# Patient Record
Sex: Female | Born: 1985 | Race: White | Hispanic: No | Marital: Married
Health system: Midwestern US, Academic
[De-identification: ages and names within clinical notes are randomized; demographics above are authoritative.]

---

## 2016-07-26 IMAGING — US OBEARLY
1 series · 13 of 16 positions shown · non-contrast
Comparison: none

[Series 1: us ob <(id) single or first f · 13 of 76 slices shown]
[im 1/76]
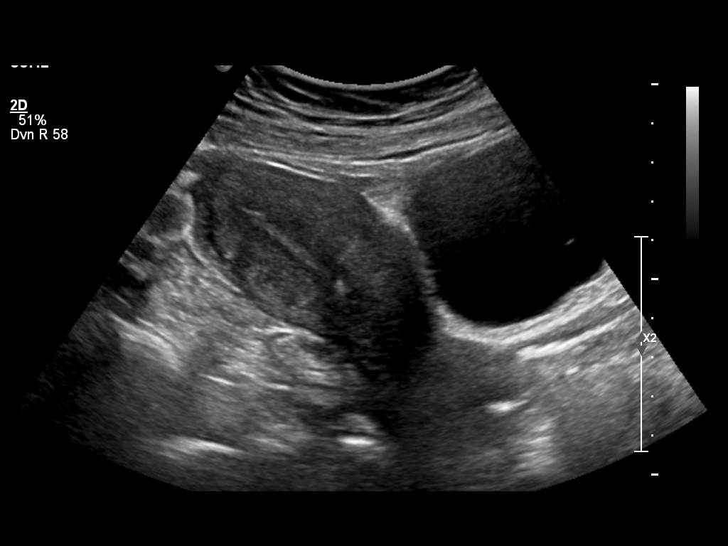
[im 6/76]
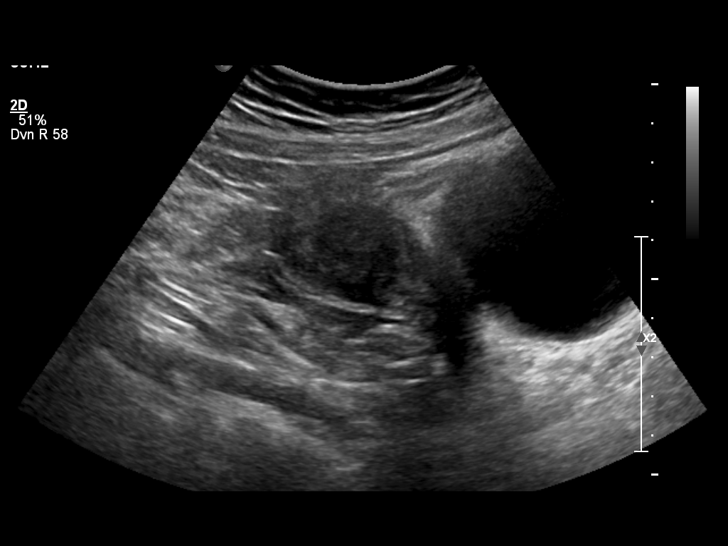
[im 16/76]
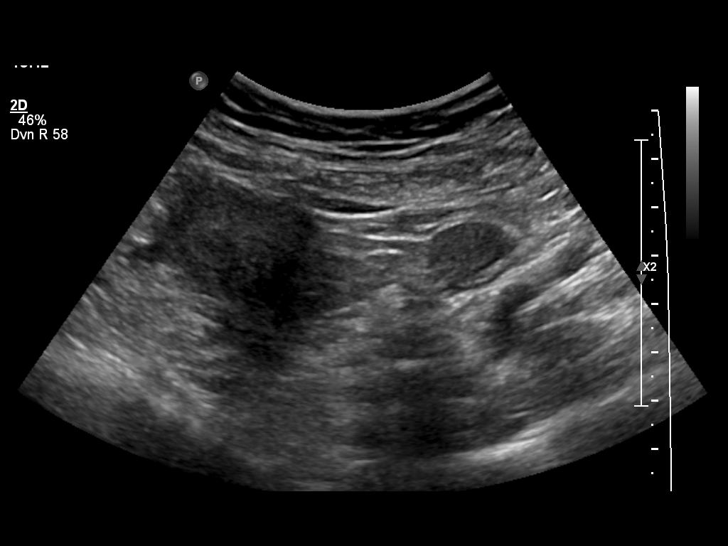
[im 21/76]
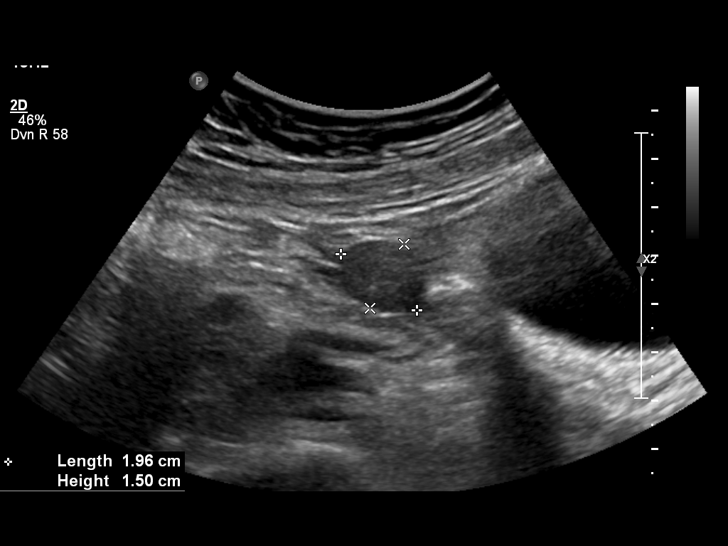
[im 26/76]
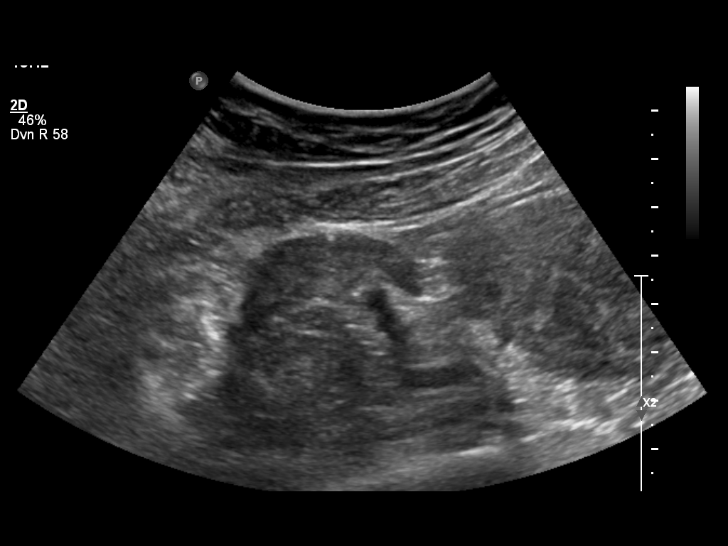
[im 31/76]
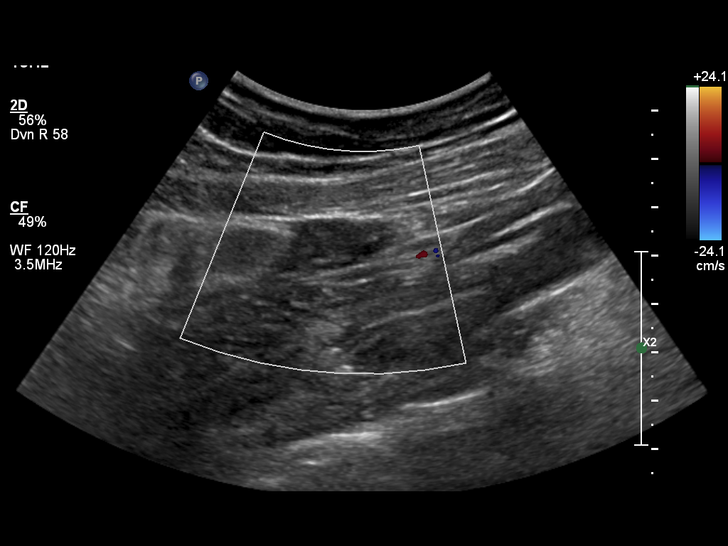
[im 41/76]
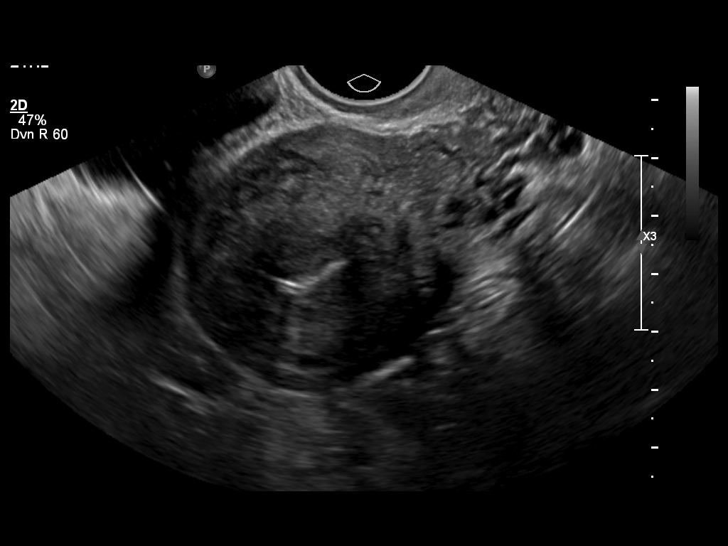
[im 46/76]
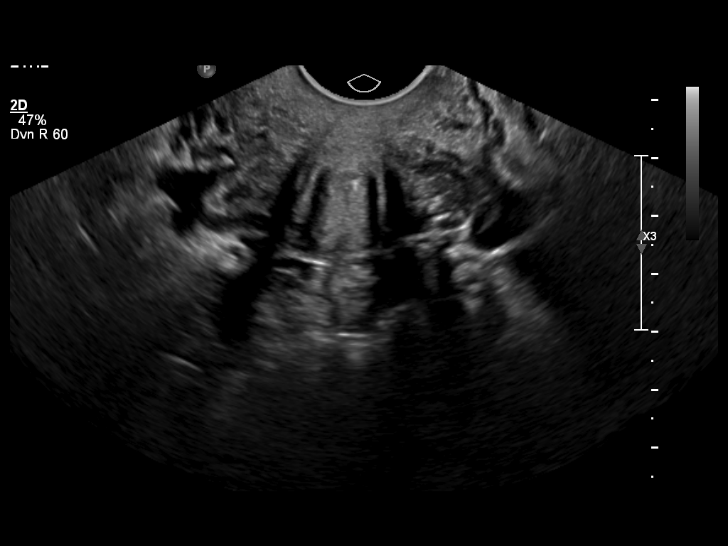
[im 51/76]
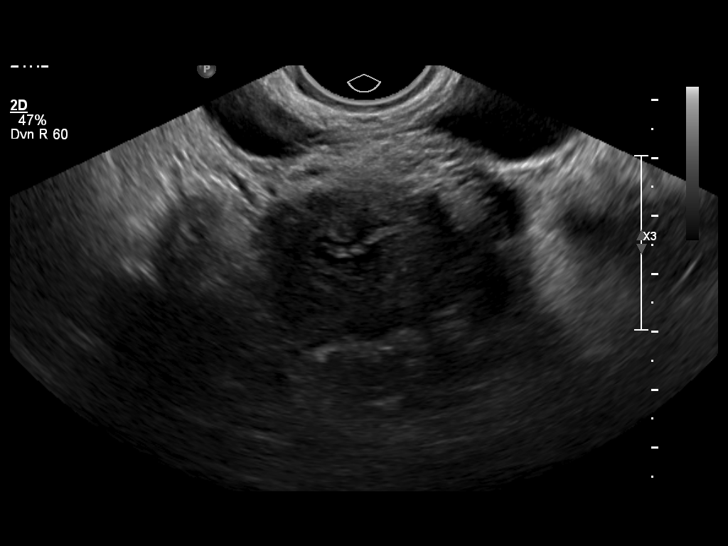
[im 56/76]
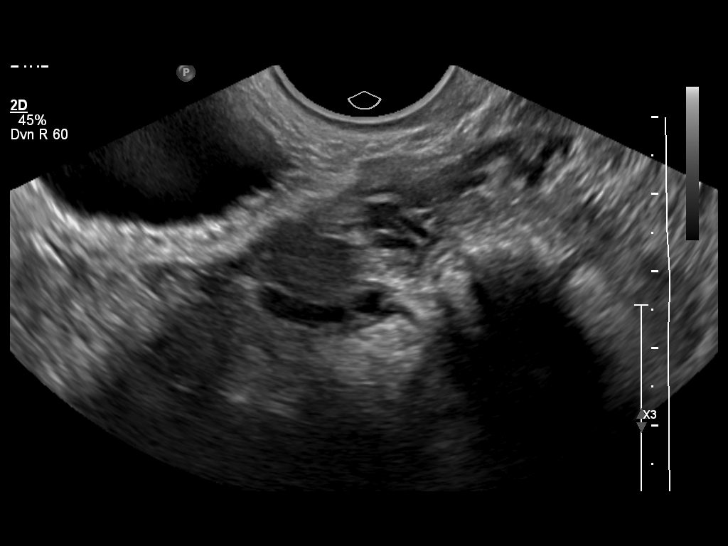
[im 61/76]
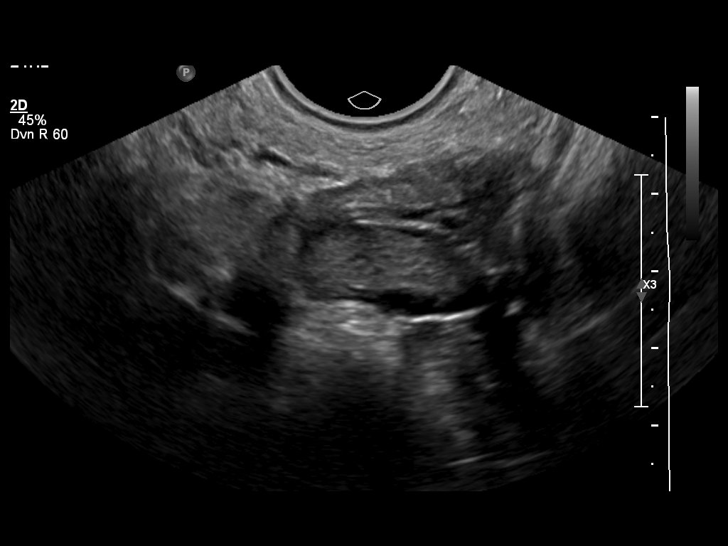
[im 71/76]
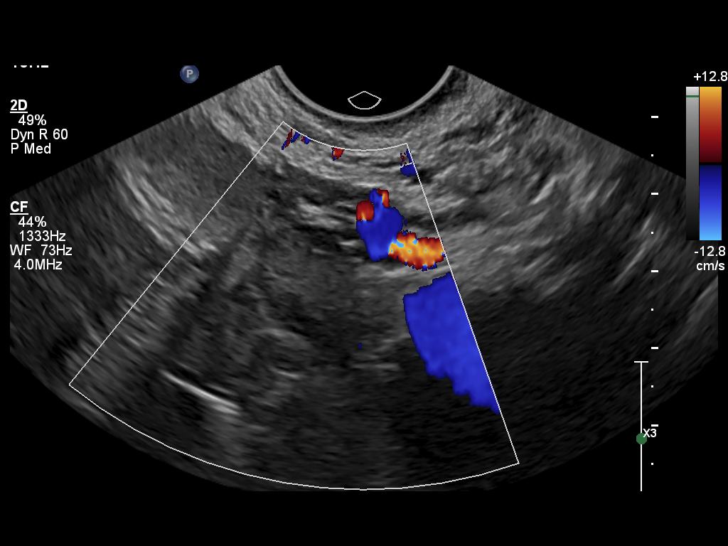
[im 76/76]
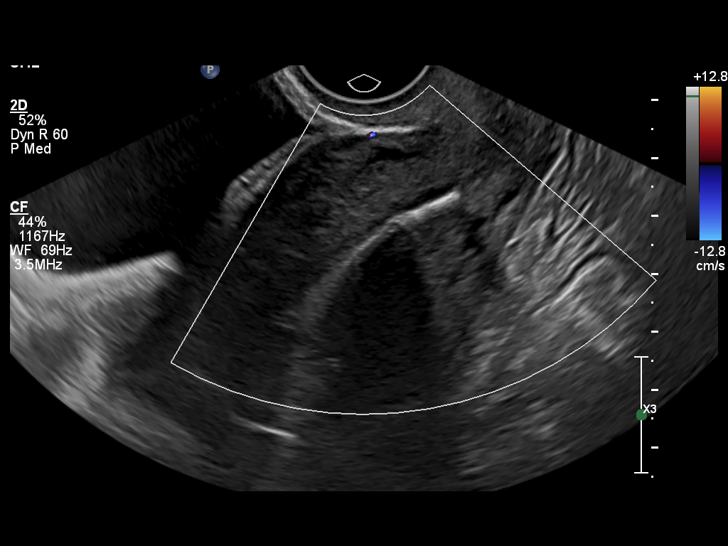

[13 of 16 positions shown; findings below may reference images not displayed]

ULTRASOUND REPORT

EXAM

ULTRASOUND, PELVIC (NONOBSTETRIC), REAL TIME WITH IMAGE DOCUMENTATION, COMPLETE, CPT 31851;
ULTRASOUND, TRANSVAGINAL, CPT 57511

INDICATION

Pregnancy with Mirena IUD in place. Quantitative beta hCG was measured at 22. JL

TECHNIQUE

Multiple static grayscale and color Doppler ultrasound images provided from a transabdominal and
transvaginal pelvic ultrasound.

COMPARISONS

There are no previous examinations available for comparison at the time of dictation.

FINDINGS

The uterus measures 9.5 x 4.6 x 5.3 cm. It is anteverted. The intrauterine device is identified in
place. There is no evidence of a fetal pole or yolk sac. There are no uterine masses. There is no
evidence of free fluid in the posterior cul-de-sac. Cervix measures 3.1 cm.

The right ovary measures 1.5 x 1.8 x 1 cm. The left ovary measures 2 x 2.3 x 1.5 cm . There are no
solid or cystic masses identified in either ovary. There are no adnexal masses. There is normal
blood flow to the ovaries identified bilaterally.

IMPRESSION

Normal uterus and ovaries. Intrauterine device is identified in place. There is no evidence of
intrauterine gestation at this time. In the absence of an intrauterine gestation an ectopic
gestation cannot be excluded and follow up with serial beta HCG and ultrasound is advised.

## 2020-04-24 ENCOUNTER — Encounter: Admit: 2020-04-24 | Discharge: 2020-04-24 | Payer: Commercial Managed Care - PPO

## 2020-04-28 ENCOUNTER — Encounter: Admit: 2020-04-28 | Discharge: 2020-04-28 | Payer: Commercial Managed Care - PPO

## 2020-04-28 DIAGNOSIS — N6452 Nipple discharge: Secondary | ICD-10-CM

## 2020-04-28 DIAGNOSIS — N6320 Unspecified lump in the left breast, unspecified quadrant: Secondary | ICD-10-CM

## 2020-05-07 ENCOUNTER — Encounter: Admit: 2020-05-07 | Discharge: 2020-05-07 | Payer: Commercial Managed Care - PPO

## 2020-05-07 NOTE — Telephone Encounter
Navigation Intake Assessment    Patient Name:  Analyah Mcconnon  DOB: 11-Aug-1985  Insurance:  [employer] Rossetti   Direct Referral: None  Appointment Info:    Future Appointments   Date Time Provider [place]   05/12/2020  1:00 PM Mina Marble, PA-C [id number] [place] Exam   05/12/2020  2:30 PM MAMMO - IC ROOM 2 IC1MAMM ICC Radiolog   05/12/2020  3:00 PM SONO - IC BREAST ROOM 2 IC1SONO ICC Radiolog       Diagnosis & Reason for Visit:  Left breast masses and pain, Left nipple discharge, 2nd opinion      Physician Info:    Referring Physician:  Self-referral   Contact Name & Number:  N/A   PCP:  Darl Householder, DO    Location of Films:   PACS    Location of Pathology:  No prior breast pathology studies performed.     History of Present Illness: Patient reports left breast pain that has been occurring since August 2021. Patient reports clear and yellow left nipple discharge (expressed only) that has been occurring since August 2021. Patient had a left breast ultrasound ([place]) on 04/18/20. 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 Patient reports she palpated a 2nd mass in her left breast within the past week. Patient would like a 2nd opinion at [facility]. Patient denies any other current breast concerns. Patient denies any history of breast biopsies or surgeries.     Allergies reviewed and verified with the patient, and documented in Epic:  Yes    Family History of Breast Cancer:   Maternal Great Aunt-Breast cancer.   Father-Prostate cancer.   Maternal Grandmother-Ovarian cancer.     Personal History of Other Cancers: None

## 2020-05-12 ENCOUNTER — Ambulatory Visit: Admit: 2020-05-12 | Discharge: 2020-05-12 | Payer: Commercial Managed Care - PPO

## 2020-05-12 ENCOUNTER — Encounter: Admit: 2020-05-12 | Discharge: 2020-05-12 | Payer: Commercial Managed Care - PPO

## 2020-05-12 DIAGNOSIS — N6320 Unspecified lump in the left breast, unspecified quadrant: Secondary | ICD-10-CM

## 2020-05-12 DIAGNOSIS — N6452 Nipple discharge: Secondary | ICD-10-CM

## 2020-05-12 DIAGNOSIS — N644 Mastodynia: Secondary | ICD-10-CM

## 2020-05-12 NOTE — Progress Notes
Name: Kelsey [name]          MRN: [id number]      DOB: [birth year]      AGE: 34 y.o.   DATE OF SERVICE: 05/12/2020               Reason for Visit:  Heme/Onc Care      Kelsey [name] is a 34 y.o. female.     DIAGNOSIS:    1. Left breast mass  2. Left nipple discharge     History of Present Illness    Kelsey [name] is a Caucasian female who presented to the [facility] on 05/12/2020 at age 34 for evaluation of left breast mass and nipple discharge. Kelsey [name] reports left breast pain that has been occurring since August 2021. She reports clear and yellow nonspontaneous left nipple dischargethat has been occurring since August 2021. 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 She palpated an area in her left breast within the past week. She enies any other current breast concerns. She denies any history of breast biopsies or surgeries.     BREAST IMAGING:  Ultrasound:    -- Left breast ultrasound 04/18/20 ([facility]) revealed benign left axillary lymph node. No suspicious imaging finding. Hypoecohic macrolobulated circumscribed solid mass with posterior acoustic enhancement likely compatible with a fibroadenoma for the patient's age. This is located at 3:00, 4 cm FTN. BI-RADS 2.      REPRODUCTIVE HEALTH:  Age at first Menarche: 47   Age at First Live Birth:  52  Age at Menopause:  Premenopausal  Gravida:  6  Para: 4  Breastfeeding:  Yes    PERTINENT PMH:  Low platelets  FAMILY HISTORY:  Maternal Great Aunt-Breast cancer. Father-Prostate cancer. Maternal Grandmother-Ovarian cancer  PHYSICAL EXAM on PRESENTATION: Right - No palpable breast masses. No skin, nipple, or areolar change. Left - Pea-sized lump at 12:30 in area of patient's palpable finding. No supraclavicular or axillary adenopathy.   REFERRED BY:  Self       Review of Systems    Constitutional: Negative for fever, chills, appetite change and fatigue.   HENT: Negative for hearing loss, congestion, rhinorrhea and tinnitus.    Eyes: Negative for pain, discharge and itching.   Respiratory: Negative for cough, chest tightness and shortness of breath.    Cardiovascular: Negative for chest pain and palpitations.   Gastrointestinal: Negative for abdominal distention, pain, nausea, vomiting, and diarrhea.   Genitourinary: Negative for frequency, vaginal bleeding, difficulty urinating and pelvic pain.   Musculoskeletal: Negative for myalgias, back pain, joint swelling and arthralgias.   Skin: Negative for rash.   Neurological: Negative for dizziness, weakness, light-headedness and headaches.   Hematological: Does not bruise/bleed easily.   Psychiatric/Behavioral: Negative for disturbed wake/sleep cycle. The patient is not nervous/anxious.    No Known Allergies    History reviewed. No pertinent past medical history.  Surgical History:   Procedure Laterality Date   ? HX WISDOM TEETH EXTRACTION       Family History   Problem Relation Age of Onset   ? Cancer-Breast Maternal Aunt    ? Cancer-Ovarian Maternal Grandmother      Social History     Socioeconomic History   ? Marital status: Married     Spouse name: Not on file   ? Number of children: Not on file   ? Years of education: Not on file   ?  Highest education level: Not on file   Occupational History   ? Not on file   Tobacco Use   ? Smoking status: Current Every Day Smoker     Packs/day: 0.50     Years: 3.00     Pack years: 1.50     Types: Cigarettes   ? Smokeless tobacco: Never Used   ? Tobacco comment: half a pack a week   Substance and Sexual Activity   ? Alcohol use: Yes     Alcohol/week: 1.0 standard drink     Types: 1 Cans of beer per week   ? Drug use: Never   ? Sexual activity: Not on file   Other Topics Concern   ? Not on file   Social History Narrative   ? Not on file                   Objective:         ? ferrous sulfate (IRON PO) Take 1 tablet by mouth daily.     Vitals:    05/12/20 1251   BP: (!) 144/101   BP Source: Arm, Left Upper   Patient Position: Sitting   Pulse: 115   Resp: 18   Temp: 36.3 ?C (97.3 ?F)   TempSrc: Temporal   SpO2: 100%   Weight: 86.3 kg (190 lb 3.2 oz)   Height: 167.6 cm (66)   PainSc: Zero     Body mass index is 30.7 kg/m?Marland Kitchen     Pain Score: Zero       Fatigue Scale: 0-None    Pain Addressed:  Diagnostic test ordered    Patient Evaluated for a Clinical Trial: No treatment clinical trial available for this patient.     [place] Cooperative Oncology Group performance status is Not applicable. Marland Kitchen     Physical Exam  Vitals reviewed.     RIGHT BREAST EXAM:  Breast:  No palpable masses. Dense tissue throughout  Skin Erythema:  No  Attachment of Overlying Skin:  No  Peau d' orange:  No  Chest Wall Attachment:  No  Nipple Inversion:  No  Nipple Discharge: No    LEFT BREAST EXAM:  Breast: Pea-sized mass at 12:30. No skin or nipple changes. Dense tissue throughout  Skin Erythema:  No  Attachment of Overlying Skin:  No  Peau d' orange:  No  Chest Wall Attachment: No  Nipple Inversion:  No  Nipple Discharge:  No    RIGHT NODAL BASIN EXAM:  Axillary:  negative  Infraclavicular:  negative  Supraclavicular:  negative    LEFT NODAL BASIN EXAM:  Axillary:  negative  Infraclavicular: negative  Supraclavicular:  negative      Constitutional: Well-developed and well-nourished. No acute distress.  HEENT:  Head: Normocephalic and atraumatic.  Eyes: Pupils are equal, round and reactive to light. No discharge. No scleral icterus.  Cardiovascular: Normal rate, regular rhythm and normal heart sounds. No murmur or gallop.  Pulmonary/Chest: Effort normal and breath sounds normal. No respiratory distress. No wheezes. No rales.   Musculoskeletal: Normal range of motion. No edema.  Neurological: Alert and oriented to person, place and time. No cranial nerve deficit.  Skin: Warm and dry. No rash noted. No erythema. No pallor.  Psychiatric: Normal mood and affect. Behavior is normal. Judgement and thought content normal.            Assessment and Plan:  Left breast mass  Left nipple discharge    Kelsey [name] had an  outside ultrasound for pain on 04/18/20 and a probable fibroadenoma was seen and no follow up imaging was recommended. In the past week, Kelsey [name] has palpated a new left breast mass at 12:30. She also reports non spontaneous left nipple discharge. We discussed that non spontaneous nipple discharge She is scheduled for targeted imaging today. 3 outcomes from the targeted imaging 1) No further follow up imaging recommended, 2) Follow up imaging in 3-6 months, or 3) Biopsy. I will contact her with the results. She does have a family history of breast and ovarian cancer in her maternal great aunt and grandmother. She reports her mother has 3 sisters none of who have had breast or ovarian cancer. I would recommend her mother consider genetic testing and if her mother is not interested, then Kelsey [name] could consider genetic testing. For her breast pain, I recommend OTC medications for her breast pain and provided a breast pain handout. She was given ample time to ask questions all of which were answered to her satisfaction. She was encouraged to call with any interval questions or concerns.    1. Targeted imaging today  2. Breast pain handout  3. Have mother consider genetic testing    Guy Begin, PA-C      ADDENDUM 05/12/20:  I spoke with Kelsey [name] and let her know that her imaging today showed a probable benign fibroadenoma. Since this is has not been seen on long term imaging a 6 month follow up was recommended. There were no abnormalities in the area of palpable concern at 12:30 or for the nipple discharge. I will have scheduling contact her for the 6 month follow up.    MAMMO DIAGNOSTIC LT/TOMO: LEFT BREAST - May 12, 2020   - ACCESSION #: [id number]   2D/3D Procedure   3D Routine views.   2D Routine views.     Prior study comparison: April 18, 2020, ultrasound.     34 year old female presents for evaluation of 2 palpable areas of concern in the left breast.     2-D and 3-D images of the left breast were obtained.     No suspicious calcifications or architectural distortion is seen.   In the 3:00 position of the left breast at middle depth, there   is a 1.3 cm circumscribed, oval, equal density mass, with benign   mammographic features.     [id number] US BREAST TARGET LT: LEFT BREAST - May 12, 2020 -   ACCESSION #: [id number]   Standard views.     Technologist: Margaretmary Dys. Banks, Sonographer   Targeted ultrasound of the palpable areas of concern in the left   breast at 12:30, 10 cm from the nipple and at 3:30, 5 cm from the   nipple, demonstrates no suspicious sonographic abnormality.     The patient reports nonpathologic, nonspontaneous left nipple   discharge and therefore targeted ultrasound of the subareolar   area was performed. No suspicious sonographic abnormality is seen   in the subareolar left breast.     Targeted ultrasound of the left breast at 2:30, 5 cm from the   nipple, demonstrates a 1.0 x 0.7 x 1.1 cm circumscribed,   hypoechoic mass with internal blood flow. This is likely a   fibroadenoma and targeted ultrasound of the left breast is   recommended in 6 months to confirm longer term stability.     No suspicious left axillary lymph nodes are seen.       Finalized by Gwynneth Aliment,  M.D. on 05/12/2020 2:20 PM.   Dictated by Gwynneth Aliment, M.D. on 05/12/2020 2:14 PM.       Electronically signed and approved by: Gwynneth Aliment, M.D.   [id number]   IMPRESSION     ASSESSMENT: BIRAD 3 -Probably benign (Overall)   Left breast MAMMO DIAG LT/T: BIRAD 0-incomplete: need additional   imaging evaluation of the left breast.   Left breast US TARGET LEFT: BIRAD 3 -probably benign finding in   the left breast.         RECOMMENDATION:   Ultrasound of the left breast in 6 months.     Guy Begin, PA-C

## 2020-05-19 ENCOUNTER — Encounter: Admit: 2020-05-19 | Discharge: 2020-05-19 | Payer: Commercial Managed Care - PPO

## 2020-05-19 DIAGNOSIS — N6320 Unspecified lump in the left breast, unspecified quadrant: Secondary | ICD-10-CM

## 2020-06-19 ENCOUNTER — Encounter: Admit: 2020-06-19 | Discharge: 2020-06-19 | Payer: Commercial Managed Care - PPO

## 2020-06-20 ENCOUNTER — Encounter: Admit: 2020-06-20 | Discharge: 2020-06-20 | Payer: Commercial Managed Care - PPO

## 2020-07-01 ENCOUNTER — Encounter: Admit: 2020-07-01 | Discharge: 2020-07-01 | Payer: Commercial Managed Care - PPO

## 2020-07-01 DIAGNOSIS — N6320 Unspecified lump in the left breast, unspecified quadrant: Secondary | ICD-10-CM

## 2020-07-02 ENCOUNTER — Encounter: Admit: 2020-07-02 | Discharge: 2020-07-02 | Payer: Commercial Managed Care - PPO

## 2020-07-28 ENCOUNTER — Encounter

## 2020-07-28 DIAGNOSIS — D45 Polycythemia vera: Secondary | ICD-10-CM

## 2020-07-28 LAB — CBC AND DIFF
Lab: 0.1 K/UL (ref 0–0.20)
Lab: 0.2 K/UL (ref 0–0.45)
Lab: 0.6 K/UL (ref 0–0.80)
Lab: 1 % (ref >60)
Lab: 11 K/UL — ABNORMAL HIGH (ref 4.5–11.0)
Lab: 14 % (ref 11–15)
Lab: 14 g/dL (ref 12.0–15.0)
Lab: 18 % — ABNORMAL LOW (ref 24–44)
Lab: 2 % (ref 0–5)
Lab: 2 K/UL (ref 1.0–4.8)
Lab: 28 pg (ref 26–34)
Lab: 33 g/dL (ref 32.0–36.0)
Lab: 43 % (ref 36–45)
Lab: 5 % (ref 4–12)
Lab: 5 M/UL — ABNORMAL HIGH (ref 4.0–5.0)
Lab: 7.7 FL (ref 7–11)
Lab: 74 % (ref 41–77)
Lab: 8.1 K/UL — ABNORMAL HIGH (ref 1.8–7.0)
Lab: 822 K/UL — ABNORMAL HIGH (ref 150–400)
Lab: 86 FL (ref 80–100)

## 2020-07-28 LAB — COMPREHENSIVE METABOLIC PANEL: Lab: 137 MMOL/L (ref 137–147)

## 2020-07-28 LAB — LDH-LACTATE DEHYDROGENASE: Lab: 160 U/L (ref 100–210)

## 2020-07-28 NOTE — Patient Instructions
Your care team:     Dr. Abdulraheem Yacoub   Hematologist   Ola Kilanko, APRN   Nurse Practitioner   Clinical Nurse Coordinators (CNC's)   Cami Solano RN, BSN   Denia Mcvicar RN, BSN       Phone numbers:     Scheduling # [phone]    CNCs Cami and Chun Sellen # [phone]  Messages left on nurses' line are checked Monday-Friday 8:00 AM - 4:00 PM.    Evening (after 4:00 PM), weekend and holiday on-call # [phone]   For urgent needs after hours, please ask for the oncologist on-call to be paged.   For urgent needs during business hours, please ask for Cami or Marye Eagen, Dr. Yacoub's nurses, to be paged.    Notes:   - Allow one business week for our office to complete any requested paperwork (FMLA, etc.)   - Allow two to three business days for all medication refills. Please call your pharmacy first to check for available refills.

## 2020-07-29 ENCOUNTER — Encounter: Admit: 2020-07-29 | Discharge: 2020-07-29 | Payer: Commercial Managed Care - PPO

## 2020-08-12 ENCOUNTER — Encounter: Admit: 2020-08-12 | Discharge: 2020-08-12 | Payer: Commercial Managed Care - PPO

## 2020-09-22 NOTE — Progress Notes
Interventional Radiology Outpatient Scheduling Checklist      1.  Name of Procedure(s):   Bone Marrow Biopsy      2.  Date of Procedure:   10/13/2020      3.  Arrival Time:   0900      4.  Procedure Time:  1000      5.  Correct Procedural Room Assignment:  IR [facility] Room #6      6.  Blood Thinners Triaged and instructed per protocol: Y/N/NA:  NA  Confirmed accurate instructions sent to patient: Y/N:  NA       7.  Procedure Order Verified: Y/N:  Yes      9.  Patient instructed to have a driver: Y/N/NA:  Yes    10.  Patient instructed on NPO status: Y/N/NA:  Yes  0200 and 0800  Confirmed accurate instructions sent to patient: Y/N:  Yes    11.  Specimen needed: Y/N/NA:  Yes   Verified Order placed: Y/N:  Yes    12.  Allergies Verified:  Y/N:  Yes    13.  Is there an Iodine Allergy: Y/N:  No  Does the Procedure Require contrast: Y/N:  NO   If so, was the IR- Contrast Allergy Pre-Procedure Medication protocol ordered: Y/NA:  NA    14.  Does the patient have labs according to IR Pre-procedure Laboratory Parameter policy: Y/N/NA:  Yes    Patient labs from 07/28/2020  OK  If No, was the patient instructed to obtain labs prior to procedure: Y/N/NA:  NA     15.  Will the patient need to be admitted or have a possible admission: Y/N:  No  If yes, confirmed accurate instructions sent to patient: Y/N/NA:  NA     16.  Patient States Understanding: Y/N:  Yes    17.  History of OSA:  Y/N:  No  If yes, confirm request to bring CPAP sent to patient: Y/N/NA:  NA    18. Does the patient have an insulin pump or continuous glucose monitor? Y/N   N/A   If yes, was the patient instructed that this will need to be removed for this procedure and to bring supplies to reapply once the procedure is complete? Y/N    19. Patient was sent electronic procedure instructions: Y/N:  Yes

## 2020-09-22 NOTE — Patient Education
Dear Ms Clent Ridges,    Thank you for choosing [facility] Interventional Radiology for your procedure. Your appointment information is listed below:    Appointment Date: 10/13/2020  Appointment Time: 10AM  Arrival Time: 9AM  Location:     ? Main Campus: [address], [place], [place]  [address]  Parking: [address] Parking Garage    INTERVENTIONAL RADIOLOGY  PRE-PROCEDURE INSTRUCTIONS SEDATION    You are scheduled for a procedure in Interventional Radiology with procedural sedation.  Please follow these instructions and any direction from your Primary Care/Managing Physician.  If you have questions about your procedure or need to reschedule please call [phone].    Medication Instructions:   You may take the following medications with a small sip of water:  Continue taking your normal morning medications as directed.    Diet Instructions:  a. (8) hours 2AM before your procedure, stop your regular diet and start a clear liquid diet.  b. (6) hours before your procedure, discontinue tube feedings and chewing tobacco.  c. (2) hours 8AM before your procedure discontinue clear liquids.  You should have nothing by mouth. This includes GUM or CANDY.     Clear Liquid Diet    Water  Apple or White Grape Juice  Coffee or tea without cream   Tea  White Cranberry Juice  Chicken Bouillon or Broth (no noodles)  Soda Pop  Popsicles   Beef Bouillon or Broth (no noodles)    Day of Exam Instructions:  1. Bathe or shower with an antibacterial soap prior to your appointment.  2. If you have a history of Obstructive Sleep Apnea (OSA) bring your CPAP/BIPAP.   3. Bring a list of your current medications and the dosages.  4. Wear comfortable clothing and leave valuables at home.  5. Arrive (1) hour prior to your appointment.  This time will be spent registering, interviewing, assessing, educating and preparing you for the test.  ? You will be with [place] anywhere from 30 minutes to 6 hours after your exam depending on your procedure.  6. You will be sedated for the procedure. A responsible adult must drive you home (no Benedetto Goad, taxis or buses are allowed) and stay with you overnight. If you do not have a driver we will be unable to perform your procedure.   7. You will not be able to return to work or drive the same day if receiving sedation.

## 2020-10-07 ENCOUNTER — Encounter: Admit: 2020-10-07 | Discharge: 2020-10-07 | Payer: Private Health Insurance - Indemnity

## 2020-10-13 ENCOUNTER — Encounter: Admit: 2020-10-13 | Discharge: 2020-10-13 | Payer: Private Health Insurance - Indemnity

## 2020-10-13 ENCOUNTER — Ambulatory Visit: Admit: 2020-10-13 | Discharge: 2020-10-13 | Payer: Private Health Insurance - Indemnity

## 2020-10-13 DIAGNOSIS — D45 Polycythemia vera: Principal | ICD-10-CM

## 2020-10-13 LAB — MANUAL DIFF
BASOPHILS: 2 % (ref 0–2)
EOSINOPHIL %: 5 % — ABNORMAL HIGH (ref 0–5)
LYMPHOCYTES: 31 % (ref 24–44)
MONOCYTES %: 3 % — ABNORMAL LOW (ref 4–12)
NEUTROPHILS,SEG: 59 % (ref 41–77)

## 2020-10-13 LAB — CBC W/DIFF BM
HEMATOCRIT: 43 % (ref 36–45)
HEMOGLOBIN: 14 g/dL (ref 12.0–15.0)
MCH: 27 pg (ref 26–34)
MCHC: 32 g/dL (ref 32.0–36.0)
MCV: 84 FL (ref 80–100)
MPV: 8.1 FL (ref 7–11)
PLATELET COUNT: 775 K/UL — ABNORMAL HIGH (ref 150–400)
RBC COUNT: 5.1 M/UL — ABNORMAL HIGH (ref 4.0–5.0)
RDW: 13 % (ref 11–15)
WBC COUNT: 8.9 K/UL (ref 4.5–11.0)

## 2020-10-13 MED ORDER — NALOXONE 0.4 MG/ML IJ SOLN
.08 mg | INTRAVENOUS | 0 refills | Status: DC | PRN
Start: 2020-10-13 — End: 2020-10-13

## 2020-10-13 MED ORDER — FLUMAZENIL 0.1 MG/ML IV SOLN
.2 mg | INTRAVENOUS | 0 refills | Status: DC | PRN
Start: 2020-10-13 — End: 2020-10-13

## 2020-10-13 MED ORDER — FENTANYL CITRATE (PF) 50 MCG/ML IJ SOLN
0 refills | Status: CP
Start: 2020-10-13 — End: ?
  Administered 2020-10-13: 15:00:00 50 ug via INTRAVENOUS
  Administered 2020-10-13: 16:00:00 25 ug via INTRAVENOUS
  Administered 2020-10-13 (×2): 50 ug via INTRAVENOUS

## 2020-10-13 MED ORDER — MIDAZOLAM 1 MG/ML IJ SOLN
0 refills | Status: CP
Start: 2020-10-13 — End: ?
  Administered 2020-10-13 (×2): 1 mg via INTRAVENOUS

## 2020-10-13 MED ORDER — SODIUM CHLORIDE 0.9 % IV SOLP
0 refills | Status: CP
Start: 2020-10-13 — End: ?
  Administered 2020-10-13: 16:00:00 500 mL via INTRAVENOUS

## 2020-10-13 MED ORDER — MIDAZOLAM 1 MG/ML IJ SOLN
1 mg | Freq: Once | INTRAVENOUS | 0 refills | Status: CP
Start: 2020-10-13 — End: ?
  Administered 2020-10-13: 15:00:00 1 mg via INTRAVENOUS

## 2020-10-13 NOTE — Other
Immediate Post Procedure Note    Date:  10/13/2020                                         Attending Physician:   Baldwin Jamaica, MD   Performing Provider:  Maurilio Lovely, MD    Consent:  Consent obtained from patient.  Time out performed: Consent obtained, correct patient verified, correct procedure verified, correct site verified, patient marked as necessary.  Pre/Post Procedure Diagnosis:  Polycythemia vera   Indications:  Polycythemia vera       Procedure(s):  Bone marrow biopsy   Findings:  Bone marrow biopsy      Estimated Blood Loss:  Minimal  Specimen(s) Removed/Disposition:  Yes, sent to pathology  Complications: None  Patient Tolerated Procedure: Well  Post-Procedure Condition:  stable    Maurilio Lovely, MD  Pager

## 2020-10-13 NOTE — Progress Notes
Sedation physician present in room. Recent vitals and patient condition reviewed between sedating physician and nurse. Reassessment completed. Determination made to proceed with planned sedation.

## 2020-10-13 NOTE — Patient Instructions
INTERVENTIONAL RADIOLOGY DISCHARGE INSTRUCTIONS   Bone Marrow Aspiration   Bone marrow is the major site of blood cell formation. A bone marrow specimen may be obtained by aspiration or needle biopsy.?Aspiration removes cells through a needle inserted into the marrow cavity of the bone.?A biopsy removes a small, solid core of marrow tissue through the needle. Aspiration/Needle Biopsies helps with the diagnosis of leukemia, anemias, and other blood disorders.?  AFTER YOUR PROCEDURE:  ? You will recover in Interventional Radiology for a minimum of 30 minutes after your procedure.  ? You will be on bedrest with bathroom privileges after the procedure.?  POST-PROCEDURE PAIN:   ? Pain control following your procedure is a priority for both you and your Physicians.  ? Some soreness or tenderness at the site is to be expected for several days. We recommend taking over the counter analgesics to help relieve this pain.  ? Alternative methods for pain relief include but not limited to heat or cold compress, relaxation techniques, rest, and changing of positions.?  ? If pain continues after 5-7 days or you have severe pain not relieved by medication, please contact us as directed below.?  POST-PROCEDURE ACTIVITY:   ? A responsible adult must drive you home. ???  ? If you receive sedation, narcotic pain medication or anesthesia for the procedure, you should not drive or operate heavy machinery or do anything that requires concentration for at least 24 hours after procedure completion.  ? It is recommended that a responsible adult be with you until morning.  ? Keep moving to decrease hip soreness.  ? Avoid any strenuous activity for the next 2-3 days following the procedure.?  POST-PROCEDURE SITE CARE:   ? Discomfort at site may occur for 2-3 days following the procedure.  ? You may apply ice to the area; 15-20 minutes.?May repeat ice every 2 hours as needed.  ? You will have a small bandage over the procedure site.? Keep this dry.?  ? You may remove it in 24 hours.  ? You may shower in 24 hours, after removing the bandage.  ? Do not submerge the procedure site for 1 week (no bathtub, swimming, hot tub, etc.)  ? Do not use ointments, creams or powders on the puncture site.  ? Be sure your hands are clean when touching near the site?  DIET/MEDICATIONS:   ? You may resume your previous diet after the procedure.  ? If you receive sedation or narcotic pain medications, avoid any foods or beverages containing alcohol for at least 24 hours after the procedure.  ? Please see the Medication Reconciliation sheet for instructions regarding resuming your home medications.?  CALL THE DOCTOR IF:   ? Bright red blood soaks the bandage.  ? You have pain not relieved by medication.? Some soreness at the site is to be expected.  ? You have signs of infection such as: fever greater than 101? F, chills, redness, warmth, swelling, drainage or pus from the puncture site.  ?  YOU OR YOUR CAREGIVER SHOULD CALL 911 FOR ANY SEVERE SYMPTOMS SUCH AS EXCESSIVE BLEEDING, SEVERE DIZZINESS, TROUBLE BREATHING OR LOSS OF CONSCIOUSNESS.  ?  ?For any of the above symptoms or for problems or concerns related to the procedure, call the [facility]'s IR at [phone] Monday-Friday from 7-5 pm.? After-hours and weekends, please call?[phone] and ask for the Interventional Radiology Resident on-call.  ?  ?

## 2020-10-13 NOTE — H&P (View-Only)
Pre Procedure History and Physical/Sedation Plan    Procedure Date: 10/13/2020     Planned Procedure(s):  Fluoroscopically guided bone marrow biopsy with sedation  Indication: Polycythemia vera  __________________________________________________________________    Chief Complaint: Polycythemia vera    History of Present Illness: Kelsey [name] is a 35 y.o. female. Pt presents to IR for bone marrow biopsy. Pt here for above procedure. Denies complaints today, specifically fever, chills, chest pain, shortness of breath, new numbness/tingling. Had a sedated BMBx at [facility] without complications.   Patient Active Problem List    Diagnosis Date Noted   ? Left breast mass 05/12/2020     No past medical history on file.   Surgical History:   Procedure Laterality Date   ? HX WISDOM TEETH EXTRACTION        Medications Prior to Admission   Medication Sig Dispense Refill Last Dose   ? aspirin EC 81 mg tablet Take 81 mg by mouth daily. Take with food.        No Known Allergies    Social History:   Social History     Tobacco Use   ? Smoking status: Former Smoker     Packs/day: 0.50     Years: 3.00     Pack years: 1.50     Types: Cigarettes     Quit date: 05/29/2020     Years since quitting: 0.3   ? Smokeless tobacco: Never Used   ? Tobacco comment: half a pack a week   Substance Use Topics   ? Alcohol use: Yes     Alcohol/week: 1.0 standard drink     Types: 1 Cans of beer per week      Family History   Problem Relation Age of Onset   ? Cancer-Breast Maternal Aunt    ? Cancer-Ovarian Maternal Grandmother         Review of Systems  See HPI    Previous Anesthetic/Sedation History:  Denies adverse events     Physical Exam:  Vital Signs: Last Filed In 24 Hours Vital Signs: 24 Hour Range                  General appearance: alert  Neurologic: Grossly normal  Lungs: Nonlabored  Heart: regular rate and rhythm  Abdomen: Non-tender, nondistended  Extremities: extremities normal, atraumatic, no cyanosis or edema    NPO Status: Acceptable  Airway:  airway assessment performed  Mallampati I (soft palate, uvula, fauces, tonsillar pillars visible)  Head and Neck: no abnormalities noted  Mouth: no abnormalities noted   Pregnancy Status: Not Pregnant       Anesthesia Classification:  ASA II  Sedation/Medication Plan: Fentanyl and Midazolam  Medications for Reversal: Naloxone and romazicon  Discussion/Reviews:  Physician has discussed risks and alternatives of this type of sedation and above planned procedures with patient      Lab/Radiology/Other Diagnostic Tests:  Labs:  Pertinent labs reviewed           Almira Coaster, APRN-NP  Pager [phone]

## 2020-11-11 ENCOUNTER — Encounter: Admit: 2020-11-11 | Discharge: 2020-11-11 | Payer: Private Health Insurance - Indemnity

## 2020-11-11 ENCOUNTER — Ambulatory Visit: Admit: 2020-11-11 | Discharge: 2020-11-11 | Payer: Private Health Insurance - Indemnity

## 2020-11-11 DIAGNOSIS — N6320 Unspecified lump in the left breast, unspecified quadrant: Secondary | ICD-10-CM

## 2020-11-17 ENCOUNTER — Encounter: Admit: 2020-11-17 | Discharge: 2020-11-17 | Payer: Private Health Insurance - Indemnity

## 2020-11-17 DIAGNOSIS — D45 Polycythemia vera: Secondary | ICD-10-CM

## 2020-11-17 DIAGNOSIS — N390 Urinary tract infection, site not specified: Secondary | ICD-10-CM

## 2020-11-17 DIAGNOSIS — N6321 Unspecified lump in the left breast, upper outer quadrant: Secondary | ICD-10-CM

## 2020-11-17 LAB — CBC AND DIFF
ABSOLUTE BASO COUNT: 0.1 K/UL (ref 0–0.20)
ABSOLUTE EOS COUNT: 0.4 K/UL (ref 0–0.45)
RBC COUNT: 4.8 M/UL — ABNORMAL LOW (ref 4.0–5.0)
WBC COUNT: 10 K/UL (ref 4.5–11.0)

## 2020-11-17 LAB — COMPREHENSIVE METABOLIC PANEL
ALBUMIN: 4.3 g/dL — ABNORMAL LOW (ref 3.5–5.0)
ALK PHOSPHATASE: 41 U/L (ref 25–110)
ALT: 10 U/L — ABNORMAL HIGH (ref 7–56)
ANION GAP: 5 K/UL (ref 3–12)
AST: 10 U/L (ref 7–40)
BLD UREA NITROGEN: 12 mg/dL (ref 7–25)
CALCIUM: 9.3 mg/dL — ABNORMAL HIGH (ref 8.5–10.6)
CO2: 27 MMOL/L (ref 21–30)
CREATININE: 0.8 mg/dL (ref 0.4–1.00)
EGFR: >60 mL/min (ref >60)
GLUCOSE,PANEL: 86 mg/dL (ref 70–100)
POTASSIUM: 3.9 MMOL/L (ref >60)
SODIUM: 137 MMOL/L (ref 137–147)
TOTAL BILIRUBIN: 0.4 mg/dL (ref 0.3–1.2)
TOTAL PROTEIN: 7.3 g/dL (ref 6.0–8.0)

## 2020-11-17 LAB — URINALYSIS DIPSTICK
GLUCOSE,UA: NEGATIVE
NITRITE: NEGATIVE
PROTEIN,UA: NEGATIVE
URINE BILE: NEGATIVE
URINE BLOOD: NEGATIVE
URINE KETONE: NEGATIVE
URINE PH: 7 (ref 5.0–8.0)

## 2020-11-17 LAB — LDH-LACTATE DEHYDROGENASE: LDH: 146 U/L — ABNORMAL LOW (ref 100–210)

## 2020-11-17 MED ORDER — PEGASYS 180 MCG/0.5 ML SC SYRG
45 ug | SUBCUTANEOUS | 3 refills | Status: AC
Start: 2020-11-17 — End: ?

## 2020-11-17 NOTE — Progress Notes
Specialty Medication Counseling  Peginterferon Alfa-2a (Pegasys?)    Kelsey [name] was provided medication education regarding her new specialty medication.     I reviewed the role of [place] specialty pharmacy, including access to medication assistance specialists if needed.     How to Take the Medication  Kelsey [name] was educated on Peginterferon Alfa-2a (Pegasys?) for polycythemia vera.  Dose, route, frequency and duration of therapy were discussed.     ? Directions: Inject 45 mcg subcutaneously every week until progression or unacceptable toxicity.   ? Patient was educated to rotate injection site (preferred injection sites: thigh, abdomen).  Educated patient to not inject near navel or waistline; patients who are thin should only use thigh or upper arm.  Do not inject into bruised, infected, irritated, red, or scarred skin. The weekly dose may be administered at bedtime to reduce flu-like symptoms.    Adherence  The patient's ability to self-administer medication was assessed.    Patient was educated on the importance of adherence and that the consequences of non-adherence could include disease progression. The patient's ability to be adherent with drug therapies was discussed and the patient was provided options for tools/resources that promote adherence to therapy. For [employer] alarms, calendars, and technology (reminder apps) were recommended and/or provided.     How to Store Medication   Kelsey [name] was educated to store under refrigeration in a safe place away from children and to bring medication to room temperature prior to use.  Do not freeze or shake.  Protect from light and discard any unused portion.  Intact vials may be stored at room temperature for up to 14 days; prefilled syringes may be stored at room temperature for up to 6 days.      How to Manage Missed Doses  I instructed the patient that if a dose is missed, she should call provider to receive instructions.     Contraindications / Safety Precautions / Adverse Effects  Contraindications to therapy, safety precautions, and common adverse effects (>30% incidence, listed below) were discussed with the patient.  I explained that most patients do NOT experience all these side effects and that this list was not inclusive.  I instructed patient to report any adverse effects to their doctor, pharmacist or nurse.   ? Fatigue, headache, rigors  ? Increased liver function tests  ? Injection site reactions  ? Myalgias/arthralgias  ? Fever    Patient was instructed to call and seek help immediately if she had:   ? Signs of depression, suicidal ideation, or any psychiatric side effects  ? Exacerbation of autoimmune disease (if applicable), infections, ischemic events (cardiac, cerebrovascular)    Vaccination Status Education   Appropriate recommended vaccinations were reviewed and discussed with the patient. The patient was also reminded about the importance of receiving an annual influenza vaccine as indicated.    Drug-Drug Interactions  A medication history and reconciliation was performed (including prescription medications, supplements, over the counter medications, and herbal products). The medication list was updated and the patient?s current medication list is included below.  I stressed the importance of maintaining an accurate medication list and informing their medical team prior to taking any new medications.     Home Medications    Medication Sig   aspirin EC 81 mg tablet Take 81 mg by mouth daily. Take with food.       Drug-Drug Interactions (DDIs)  DDIs were evaluated: No significant drug-drug interactions were identified.  Drug-Food Interactions  Drug-food interactions were evaluated: No significant drug-food interactions were identified.     REMS Program  No REMS is required for this medication.    Reproductive Concerns  Reproductive concerns were reviewed with the patient. As patient is a female of child-bearing potential, she was instructed regarding effective contraception during and after treatment, and that in the event that she becomes pregnant she should contact her physician immediately.     What to Do With Any Unused or Expired Medications  Appropriate safe handling and disposal procedures were reviewed with the patient. Kelsey [name] was instructed to return any unused or expired oral chemotherapy medication to a designated disposal bin at one of the [facility] locations or to utilize a community drug take back program. Instructed not to flush down the toilet or to crush and dispose of medication in the trash.    Monitoring  Monitoring and follow-up plan was discussed with patient. Kelsey [name] was instructed to contact the oral chemotherapy pharmacist at [phone] if they have any questions or concerns regarding their medication therapy.  Informed the patient that we would send the prescription to a specialty pharmacy and that the pharmacy would be calling the patient to schedule a shipment.  Emphasized if their phone calls were not answered, the specialty pharmacy would not ship the medication.    This medication is considered medium risk per our internal oral chemotherapy risk categorization and the patient will be contacted for education, toxicity check at 2 weeks, one reassessment at 3 months and then annually, if applicable (medium risk monitoring).       Questions  Patient was given the opportunity to ask questions. Patient verbalized understanding, agreed with the plan and had no questions or concerns regarding therapy.     Raleigh Nation, [facility]  Clinical Pharmacist  11/17/20

## 2020-11-17 NOTE — Progress Notes
Initial Assessment: Oral Chemotherapy  Peginterferon Alfa-2a (Pegasys?)    Kelsey [name] is a 35 y.o. female with a diagnosis of polycythemia vera    Indication/Regimen  Peginterferon Alfa-2a (Pegasys?) is being used appropriately for treatment of polycythemia vera.      The dosing regimen of 45 mcg injected subcutaneously every week is appropriate for Kelsey [name]. It is planned to continue until progression or unacceptable toxicity.       Therapeutic Goals  Plan Name OP Peginterferon (Pegasys)   Treatment Goal Supportive Care Plan   Treatment Plan Start Date 11/17/2020 (Planned)           Medical History  History reviewed. No pertinent past medical history.     Patient Oncology History     Cancer Diagnosis: Cancer Staging  No matching staging information was found for the patient.   Relevant tumor markers:    Past treatment regimens:       Height, Weight, BSA    Wt Readings from Last 1 Encounters:   11/17/20 90.6 kg (199 lb 12.8 oz)     Estimated body surface area is 2.05 meters squared as calculated from the following:    Height as of this encounter: 167.4 cm (5' 5.91).    Weight as of this encounter: 90.6 kg (199 lb 12.8 oz).    Allergies  No Known Allergies    Baseline Labs  CBC w/Diff    Lab Results   Component Value Date/Time    WBC 10.4 11/17/2020 12:56 PM    RBC 4.88 11/17/2020 12:56 PM    HGB 12.7 11/17/2020 12:56 PM    HCT 38.8 11/17/2020 12:56 PM    MCV 79.5 (L) 11/17/2020 12:56 PM    MCH 26.0 11/17/2020 12:56 PM    MCHC 32.8 11/17/2020 12:56 PM    RDW 14.2 11/17/2020 12:56 PM    PLTCT 825 (H) 11/17/2020 12:56 PM    MPV 7.8 11/17/2020 12:56 PM    Lab Results   Component Value Date/Time    NEUT 70 11/17/2020 12:56 PM    ANC 7.30 (H) 11/17/2020 12:56 PM    LYMA 20 (L) 11/17/2020 12:56 PM    ALC 2.00 11/17/2020 12:56 PM    MONA 5 11/17/2020 12:56 PM    AMC 0.60 11/17/2020 12:56 PM    EOSA 4 11/17/2020 12:56 PM    AEC 0.40 11/17/2020 12:56 PM    BASA 1 11/17/2020 12:56 PM    ABC 0.10 11/17/2020 12:56 PM Comprehensive Metabolic Profile    Lab Results   Component Value Date/Time    NA 137 11/17/2020 12:56 PM    K 3.9 11/17/2020 12:56 PM    CL 105 11/17/2020 12:56 PM    CO2 27 11/17/2020 12:56 PM    GAP 5 11/17/2020 12:56 PM    BUN 12 11/17/2020 12:56 PM    CR 0.81 11/17/2020 12:56 PM    GLU 86 11/17/2020 12:56 PM    Lab Results   Component Value Date/Time    CA 9.3 11/17/2020 12:56 PM    ALBUMIN 4.3 11/17/2020 12:56 PM    TOTPROT 7.3 11/17/2020 12:56 PM    ALKPHOS 41 11/17/2020 12:56 PM    AST 10 11/17/2020 12:56 PM    ALT 10 11/17/2020 12:56 PM    TOTBILI 0.4 11/17/2020 12:56 PM        Serum creatinine: 0.81 mg/dL 16/10/96 0454  Estimated creatinine clearance: 112.9 mL/min    Pregnancy status  The patient's pregnancy status  was assessed. As patient is a female of child-bearing potential, she will be instructed regarding effective contraception during and after treatment, and that in the event that she becomes pregnant she should contact her physician immediately.     Medication Administration Assessment  The patient has the ability to self-administer this medication.     Medication Reconciliation  Home Medications    Medication Sig   aspirin EC 81 mg tablet Take 81 mg by mouth daily. Take with food.       Medication reconciliation is based on the patient?s most recent medication list in the electronic medical record (EMR) including herbal products and OTC medications. The patient's medication list will be updated during patient education, after speaking with the patient and prior to dispensing the medication.     Drug-drug interactions (DDIs)  DDIs were evaluated: No significant drug-drug or drug-food interactions were identified.     Drug-Food Interactions  Drug-food interactions were evaluated: No significant drug-food interactions were identified.     Contraindications  The following contraindications to the use of Peginterferon Alfa-2a (Pegasys?) were reviewed:  ? Autoimmune hepatitis  ? Hepatic decompensation in patients with cirrhosis  ? Use in neonates/infants  ? Known hypersensitivity (urticaria, angioedema, bronchoconstriction, anaphylaxis to alpha interferons or any component of the product)    No contraindications to therapy were identified.     Vaccination Status Assessment     There is no immunization history on file for this patient.    Vaccine history was reviewed. Appropriate recommended vaccinations were reviewed. The patient will be reminded about the importance of receiving an annual influenza vaccine as indicated.    Safety Precautions  The following safety precautions to the use of Peginterferon Alfa-2a (Pegasys?) were reviewed:  Autoimmune disease, bone marrow suppression, cardiovascular disease, CNS depression, dermatologic effects, diabetes, gastrointestinal effects, hepatic effects (especially in hepatitis B patients), hypersensitivity reactions, infectious disorders, ischemic disorders, neuropsychiatric disorders, ophthalmic effects, pancreatitis, pulmonary effects, seizure disorders, thyroid disorders    Safety precautions for this medication have been reviewed. No concerns have been identified.     Risk Evaluation and Mitigation Strategy (REMS) Assessment  No REMS is required for this medication.    Initial therapy assessment has been completed and the patient will be contacted to complete education on their regimen.     Raleigh Nation, [facility]  Clinical Pharmacist  11/17/20

## 2020-11-17 NOTE — Patient Instructions
Your care team:     Dr. Abdulraheem Yacoub   Hematologist   Ola Kilanko, APRN   Nurse Practitioner   Clinical Nurse Coordinators (CNC's)   Cami Solano RN, BSN   Laura Vaughan RN, BSN    Grace Rohrer RN, BSN      Phone numbers:     Scheduling # [phone]    CNCs Cami, Laura and Grace # [phone]  Messages left on nurses' line are checked Monday-Friday 8:00 AM - 4:00 PM.    Evening (after 4:00 PM), weekend and holiday on-call # [phone]   For urgent needs after hours, please ask for the oncologist on-call to be paged.   For urgent needs during business hours, please ask for Cami or Laura, Dr. Yacoub's nurses, to be paged.    Notes:   - Allow one business week for our office to complete any requested paperwork (FMLA, etc.)   - Allow two to three business days for all medication refills. Please call your pharmacy first to check for available refills.

## 2020-11-18 ENCOUNTER — Encounter: Admit: 2020-11-18 | Discharge: 2020-11-18 | Payer: Private Health Insurance - Indemnity

## 2020-11-19 ENCOUNTER — Encounter: Admit: 2020-11-19 | Discharge: 2020-11-19 | Payer: Private Health Insurance - Indemnity

## 2020-11-19 DIAGNOSIS — D45 Polycythemia vera: Secondary | ICD-10-CM

## 2020-11-19 NOTE — Progress Notes
The Prior Authorization for pegasys has been submitted for Sibyl Parr via Cover My Meds.  Will continue to follow.    [place] Patient Advocate  [phone]

## 2020-11-24 ENCOUNTER — Encounter: Admit: 2020-11-24 | Discharge: 2020-11-24 | Payer: Private Health Insurance - Indemnity

## 2020-11-24 DIAGNOSIS — N6321 Unspecified lump in the left breast, upper outer quadrant: Secondary | ICD-10-CM

## 2020-11-24 MED ORDER — PEGASYS 180 MCG/0.5 ML SC SYRG
45 ug | SUBCUTANEOUS | 3 refills | Status: AC
Start: 2020-11-24 — End: ?

## 2020-11-24 NOTE — Progress Notes
The Prior Authorization for pegasys was approved for Kelsey [name] from 11/19/2020 to 11/19/2021.   The [place] authorization number is [id number].    Kelsey [name]'s prescription for pegasys is not able to be filled by [facility] [place] because the patient's prescription insurance mandates them to fill at the insurance plan's preferred outside pharmacy.  Our pharmacy will work with clinic so that their prescription is sent to [place] [phone]) as required by their prescription insurance coverage.        [place] Patient Advocate  [phone]

## 2020-12-02 ENCOUNTER — Encounter: Admit: 2020-12-02 | Discharge: 2020-12-02 | Payer: Private Health Insurance - Indemnity

## 2020-12-02 NOTE — Progress Notes
Contacted [facility] to check the status of pts Pegasys script. Was told that pt is currently working through financial assistance and that script is not ready to fill yet.

## 2020-12-05 ENCOUNTER — Encounter: Admit: 2020-12-05 | Discharge: 2020-12-05 | Payer: Private Health Insurance - Indemnity

## 2020-12-05 NOTE — Telephone Encounter
Spoke with pt. Updated her that [facility] would be shipping the Pegasys to her home. Confirmed plan that medication will remain in her fridge until return appt on 8/25. She will bring first dose with her in cooler with ice packs to appt. Pt states understanding and has no further questions.

## 2020-12-17 ENCOUNTER — Encounter: Admit: 2020-12-17 | Discharge: 2020-12-17 | Payer: Private Health Insurance - Indemnity

## 2020-12-17 NOTE — Telephone Encounter
CALLED PT GOT HER RESCHEDULED DUE TO YACOUB BEING OOC 8/25 PT AGREED TO DATE AND ASKED FOR TIME

## 2021-01-22 ENCOUNTER — Encounter: Admit: 2021-01-22 | Discharge: 2021-01-22 | Payer: Private Health Insurance - Indemnity

## 2021-01-23 ENCOUNTER — Encounter: Admit: 2021-01-23 | Discharge: 2021-01-23 | Payer: Private Health Insurance - Indemnity

## 2021-01-26 ENCOUNTER — Encounter: Admit: 2021-01-26 | Discharge: 2021-01-26 | Payer: Private Health Insurance - Indemnity

## 2021-01-26 DIAGNOSIS — D45 Polycythemia vera: Secondary | ICD-10-CM

## 2021-01-26 LAB — CBC AND DIFF: WBC COUNT: 8.7 K/UL (ref 4.5–11.0)

## 2021-01-26 LAB — COMPREHENSIVE METABOLIC PANEL
BLD UREA NITROGEN: 12 mg/dL — ABNORMAL LOW (ref 7–25)
CHLORIDE: 103 MMOL/L (ref 98–110)
CREATININE: 0.7 mg/dL (ref 0.4–1.00)
GLUCOSE,PANEL: 95 mg/dL — ABNORMAL LOW (ref 70–100)
SODIUM: 137 MMOL/L — ABNORMAL HIGH (ref 137–147)

## 2021-01-26 LAB — LDH-LACTATE DEHYDROGENASE: LDH: 154 U/L (ref 100–210)

## 2021-01-26 NOTE — Patient Instructions
Your care team:     Dr. Abdulraheem Yacoub   Hematologist   Ola Kilanko, APRN   Nurse Practitioner   Clinical Nurse Coordinators (CNC's)   Cami Solano RN, BSN   Laura Vaughan RN, BSN    Grace Rohrer RN, BSN      Phone numbers:     Scheduling # [phone]    CNCs Cami, Laura and Grace # [phone]  Messages left on nurses' line are checked Monday-Friday 8:00 AM - 4:00 PM.    Evening (after 4:00 PM), weekend and holiday on-call # [phone]   For urgent needs after hours, please ask for the oncologist on-call to be paged.   For urgent needs during business hours, please ask for Cami or Laura, Dr. Yacoub's nurses, to be paged.    Notes:   - Allow one business week for our office to complete any requested paperwork (FMLA, etc.)   - Allow two to three business days for all medication refills. Please call your pharmacy first to check for available refills.

## 2021-01-26 NOTE — Progress Notes
Name: Kelsey [name]          MRN: [id number]      DOB: 04-25-1986      AGE: 35 y.o.   DATE OF SERVICE: 01/26/2021    Subjective:             Reason for Visit:  Follow Up    Interval history  The patient is here for follow-up.  She has completed her planned work-up and she is here to start therapy with interferon.  She met with the psychology team, per her report. Awaiting documentation   She has not had her ophthalmological examination however exam in clinic today was unremarkable she has no ophthalmological complaints.  She is up-to-date with health maintenance and follow-up with primary care physician.  In the interim she had a breast ultrasound that identified benign findings.  She reports significant constitutional symptoms including excessive fatigue, and uncontrollable headaches.  She was not tolerating phlebotomy well given symptoms that she develops including fatigue and confusion after phlebotomy.  Pegasys was approved through a grant.        Brief history  Patient is referred to hematology and oncology based on request of Dr. Donnajean Lopes  for second opinion regarding a recent diagnosis of an MPN.  Patient is 35 years old.  She is reportedly in good health.  She is married and has 4 children.  She follows with physicians infrequently.  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    Review of her hematologic history she was found to have high hemoglobin and platelet counts.  This was repeated and was confirmed on multiple occasions.  Serum erythropoietin level was suppressed.  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    The patient has had 1 phlebotomy so far which has reduced her hematocrit and hemoglobin and also have partially improved her symptoms    Patient has done multiple online support groups and is interested in interferon therapy in the future.    She is here with her husband for initial evaluation.  She is not in any pharmacotherapy.    Kelsey [name] is a 35 y.o. female.     Cancer Staging  No matching staging information was found for the patient.    History of Present Illness  Family Hx: Father-prostate cancer    MGM-ovarian cancer    MGF-brain tumor  Great aunt-breast cancer    Medical Hx: Depression/anxiety, COVID infection September 2021   Surgical Hx: Wisdom tooth extraction    Social Hx:  Married  Current smoker   Alcohol use    HPI:  Patient was seen for consult by hematology 04/23/2020 due to an elevated platelet count that first became evident in early November.  Patient had no previous history of thrombocytosis.   Patient's work up included additional labs and a BMBX.  She is a direct referral to Dr Molli Knock to discuss treatment options.       Records are in [facility] including outside records that have been scanned into the chart.  Additional records are in Care Everywhere.     Timeline of Events:   DATES       04/15/2020 Labs CBC:  WBC 11.7, Hg 15.5, Hct 48.3,  platelets 817   04/17/2020 Labs  Peripheral blood:  Erythrocytosis and  thrombocytosis    04/23/2020 Labs CBC:  WBc 10.2, Hg 14.8, Hct 47.4, platelets 888  Folate:  16.65  B12:  564  Iron studies:  Iron 62.00, TIBC 335.0, % Sat 18.51, Ferritin  32.0  LDH:  210  Erythropoietin:  2.4   05/27/2020 US-Abdomen complete  Impression:  Minimal hepatosplenomegaly just above normal limites.  Normal echogenicity of the liver and spleen.     06/11/2020 BMBX BM:  Normocellular bone with megakaryocytic hyperplasia and mild reticulin fibrosis.    FLOW:  No diagnostic immunophenotypic abnormalities detected.  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 Negative for activity change, appetite change, chills, fever and unexpected weight change.   HENT: Negative for congestion, mouth sores, sore throat, tinnitus and trouble swallowing.    Eyes: Positive for visual disturbance.   Respiratory: Negative for cough, chest tightness, shortness of breath and wheezing.    Cardiovascular: Negative for chest pain, palpitations and leg swelling.   Gastrointestinal: Negative for abdominal distention, abdominal pain, blood in stool, constipation, diarrhea, nausea and vomiting.   Genitourinary: Negative for difficulty urinating, dysuria, frequency and urgency.   Musculoskeletal: Negative for arthralgias, back pain and joint swelling.   Skin: Negative for rash.   Neurological: Positive for headaches. Negative for dizziness, weakness and numbness.   Hematological: Negative for adenopathy. Does not bruise/bleed easily.   Psychiatric/Behavioral: Negative for sleep disturbance. The patient is not nervous/anxious.          Objective:         ? aspirin EC 81 mg tablet Take 81 mg by mouth daily. Take with food.   ? peginterferon alfa-2a (PEGASYS) 180 mcg/0.5 mL injection syringe Inject 0.125 mL under the skin every 7 days.     Vitals:    01/26/21 1045   BP: (!) 131/90   Pulse: 79   Temp: 37.1 ?C (98.8 ?F)   Resp: 16   SpO2: 99%   PainSc: Zero   Weight: 90.7 kg (200 lb)     Body mass index is 32.37 kg/m?Marland Kitchen     Pain Score: Zero       Fatigue Scale: 0-None    Pain Addressed:  N/A    Patient Evaluated for a Clinical Trial: No treatment clinical trial available for this patient.     [place] Cooperative Oncology Group performance status is 0, Fully active, able to carry on all pre-disease performance without restriction.Marland Kitchen     Physical Exam  Exam conducted with a chaperone present.   HENT:      Head: Normocephalic.      Mouth/Throat:      Pharynx: No oropharyngeal exudate.   Eyes:      General: No scleral icterus. Conjunctiva/sclera: Conjunctivae normal.      Pupils: Pupils are equal, round, and reactive to light.   Neck:      Thyroid: No thyromegaly.      Trachea: No tracheal deviation.   Cardiovascular:      Rate and Rhythm: Normal rate and regular rhythm.   Pulmonary:      Effort: Pulmonary effort is normal. No respiratory distress.      Breath  sounds: Normal breath sounds. No wheezing or rales.   Abdominal:      General: Bowel sounds are normal. There is no distension.      Palpations: There is no mass.      Tenderness: There is no abdominal tenderness. There is no guarding or rebound.   Musculoskeletal:      Cervical back: Normal range of motion and neck supple.   Lymphadenopathy:      Cervical: No cervical adenopathy.   Skin:     Findings: No rash.   Neurological:      Mental Status: She is alert.      Cranial Nerves: No cranial nerve deficit.      Gait: Gait is intact.   Psychiatric:         Mood and Affect: Mood and affect normal.         Cognition and Memory: Memory normal.         Judgment: Judgment normal.               Assessment and Plan:    In regard to diagnosis:  Patient presents with significant erythrocytosis and panmyelosis.  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  Serum EPO level suppressed.  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  Thrombocytosis and leukocytosis can also be observed in PV.    The patient has high risk bone marrow features concerning for possible evidence of early myelofibrosis.  This is not associated with increased LDH, leukoerythroblastosis, or other high risk features at this time.  Repeat bone marrow biopsy at [facility] as possible dysplastic features, this is not clinically consistent with the patient's presentation with persistent panmyelosis and requirement for phlebotomy.  Her cytogenetics and molecular profile is not of high concern.  Future bone marrow biopsy will be required to further delineate disease evolution.    In regard to prognosis  I counseled the patient and her husband regarding the prognosis polycythemia vera which is associated with normal life expectancy to compared to peers,in general, however will require therapy to reduce disease related symptoms and disease related complications..  I also warned the patient about the risk of transformation to high risk disease in future.  We will continue to watch for evolution by serial bone marrow biopsies    In regard to goals of care  I emphasized the importance of symptom control as well as a hematocrit control to achieve a hematocrit goal of under 45% consistently.  Also discussed with the patient the goal of reducing thrombotic risk with aspirin.  Patient has researched interferon use and is interested in interferon therapy early on for as a disease modifying intervention.    The patient has achieved cytoreduction with phlebotomy however she continues to have significant symptom burden and poor quality of life.  She is interested in pharmacotherapy.  We discussed at length the use of hydroxyurea and interferon, mechanism of action, pros and cons, financial aspects and logistic aspects.  I have also discussed the collaboration with her primary oncologist locally given her long commute.  After discussion she opted to proceed with pegylated interferon therapy.  She signed informed consent for the interferon.  She met with the pharmacist for further education.  We will start the patient on interferon in August  after she returns from her vacation.    Treatment  Pegasys 45 mcg subcutaneously weekly Started 01/26/21 (first dose given in clinic.)    We will continue the current dose of Pegasys, the patient will return in 6 weeks with repeat TSH.  We will attempt dose escalation on return.      In regard to fertility:  The patient is not interested in future fertility, husband had permanent surgical contraception    In regard to health maintenance, survivorship  and primary prevention  ? The patient had abnormal breast mass for which she underwent imaging that is consistent with benign findings.  Exam her primary care and gynecology.  ? Recommended skin exam by dermatologist.  ? For the primary care prevention for cardiovascular risk reduction.      The patient and her husband had multiple inquiries that were answered to their satisfaction.  Thank you for involving [place] in the care of this pleasant patient      Total of 40 minutes were spent on the same day of the visit including preparing to see the patient, obtaining  and/or reviewing separately obtained history, performing a medically appropriate examination and/or  evaluation, counseling and educating the patient/family/caregiver, ordering medications, tests, or  procedures, referring and communication with other health care professionals, documenting clinical  information in the electronic or other health record, independently interpreting results and communicating  results to the patient/family/caregiver, and care coordination.

## 2021-02-10 ENCOUNTER — Encounter: Admit: 2021-02-10 | Discharge: 2021-02-10 | Payer: Private Health Insurance - Indemnity

## 2021-02-10 NOTE — Progress Notes
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  Cycle 1 start date was 01/26/21. Kelsey [name] confirms she is taking the medication as prescribed - 45 mcg every 7 days .     Adverse Effects and Adherence Assessment   Kelsey [name] is not experiencing any significant adverse effects to this medication regimen.     The patient's ability to self-administer medication was re-assessed. The patient has the ability to self-administer this medication. . Kelsey [name] reports missing 0 doses since the start of therapy. she was re-educated on importance of adherence.    Dose Appropriateness Assessment  No dose adjustments based on toxicity are required at this time. and treatment will continue until progression or unacceptable toxicity.      CBC w/Diff    Lab Results   Component Value Date/Time    WBC 8.7 01/26/2021 10:35 AM    RBC 5.38 (H) 01/26/2021 10:35 AM    HGB 13.7 01/26/2021 10:35 AM    HCT 42.4 01/26/2021 10:35 AM    MCV 78.8 (L) 01/26/2021 10:35 AM    MCH 25.5 (L) 01/26/2021 10:35 AM    MCHC 32.3 01/26/2021 10:35 AM    RDW 17.3 (H) 01/26/2021 10:35 AM    PLTCT 845 (H) 01/26/2021 10:35 AM    MPV 7.6 01/26/2021 10:35 AM    Lab Results   Component Value Date/Time    NEUT 60 01/26/2021 10:35 AM    ANC 5.20 01/26/2021 10:35 AM    LYMA 27 01/26/2021 10:35 AM    ALC 2.30 01/26/2021 10:35 AM    MONA 7 01/26/2021 10:35 AM    AMC 0.60 01/26/2021 10:35 AM    EOSA 4 01/26/2021 10:35 AM    AEC 0.40 01/26/2021 10:35 AM    BASA 2 01/26/2021 10:35 AM    ABC 0.10 01/26/2021 10:35 AM        Comprehensive Metabolic Profile    Lab Results   Component Value Date/Time    NA 137 01/26/2021 10:35 AM    K 4.0 01/26/2021 10:35 AM    CL 103 01/26/2021 10:35 AM    CO2 28 01/26/2021 10:35 AM    GAP 6 01/26/2021 10:35 AM    BUN 12 01/26/2021 10:35 AM    CR 0.76 01/26/2021 10:35 AM    GLU 95 01/26/2021 10:35 AM    Lab Results   Component Value Date/Time CA 9.3 01/26/2021 10:35 AM    ALBUMIN 4.5 01/26/2021 10:35 AM    TOTPROT 7.4 01/26/2021 10:35 AM    ALKPHOS 39 01/26/2021 10:35 AM    AST 13 01/26/2021 10:35 AM    ALT 14 01/26/2021 10:35 AM    TOTBILI 0.3 01/26/2021 10:35 AM            Serum creatinine: 0.76 mg/dL 45/40/98 1191  Estimated creatinine clearance: 119.2 mL/min    Medication Reconciliation and Drug/Food Interaction Assessment  Kelsey [name] reports no medication changes since her last medication history. she was instructed to speak with her healthcare provider and/or the oral chemotherapy pharmacist before starting any new drug, including, but not limited to, prescription, over the counter, natural / herbal products, or vitamins and supplements.     Drug-drug and drug-food interactions were assessed between the patients? specialty medication(s) and her most current medication list. No significant drug-drug or drug-food interactions were identified.     No Known Allergies    Immunization  Assessment     There is no immunization history on file for this patient.    Vaccine history and recommended vaccinations were reviewed and will be recommended as appropriate. The patient will be reminded about the importance of receiving an annual influenza vaccine as indicated.    Safety Assessment  Risk Evaluation and Mitigation Strategy (REMS)  No REMS is required for this medication.    Reproductive Risk  Kelsey [name] is a 35 y.o. female. As patient is a female of child-bearing potential, she was instructed regarding effective contraception during and after treatment, and that in the event that she becomes pregnant she should contact her physician immediately.     Handling and Disposal  Appropriate safe handling and disposal procedures were reviewed with the patient. Kelsey [name] was instructed to return any unused or expired oral chemotherapy medication to a designated disposal bin at one of the [facility] locations or to utilize a community drug take back program. Instructed not to flush down the toilet or to crush the medication    Follow-up Plan   Kelsey [name] was encouraged to call the oral chemotherapy pharmacist at [phone] with questions. This medication is considered medium risk per our internal oral chemotherapy risk categorization. This re-assessment has been completed and the next reassessment planned for 3 months.    Raleigh Nation, [facility]  Oncology Clinical Pharmacist  02/10/2021

## 2021-03-16 ENCOUNTER — Encounter: Admit: 2021-03-16 | Discharge: 2021-03-16 | Payer: Private Health Insurance - Indemnity

## 2021-03-16 DIAGNOSIS — D45 Polycythemia vera: Secondary | ICD-10-CM

## 2021-03-16 LAB — COMPREHENSIVE METABOLIC PANEL
ALBUMIN: 4.4 g/dL (ref 3.5–5.0)
CALCIUM: 9.4 mg/dL — ABNORMAL HIGH (ref 8.5–10.6)
CHLORIDE: 105 MMOL/L — ABNORMAL LOW (ref 98–110)
CREATININE: 0.8 mg/dL — ABNORMAL HIGH (ref 0.4–1.00)
POTASSIUM: 4.2 MMOL/L (ref 3.5–5.1)
SODIUM: 137 MMOL/L (ref 137–147)
TOTAL BILIRUBIN: 0.3 mg/dL (ref >60)
TOTAL PROTEIN: 7.3 g/dL (ref 6.0–8.0)

## 2021-03-16 LAB — CBC AND DIFF
MCH: 25 pg — ABNORMAL LOW (ref 26–34)
MCHC: 32 g/dL (ref 32.0–36.0)

## 2021-03-16 MED ORDER — PEGASYS 180 MCG/0.5 ML SC SYRG
45 ug | SUBCUTANEOUS | 3 refills | Status: DC
Start: 2021-03-16 — End: 2021-03-16

## 2021-03-16 MED ORDER — PEGASYS 180 MCG/0.5 ML SC SYRG
90 ug | SUBCUTANEOUS | 3 refills | Status: AC
Start: 2021-03-16 — End: ?

## 2021-03-16 NOTE — Research Notes
Study Title: MPN-RC [place]: [id number]  Consent Version Date: 08/08/2018 through non-expiring     MPN-[facility] Research Tissue bank was discussed with subject and her husband during this visit. Subject was alert and oriented during consent discussion. Subject was informed that tissue bank participation is voluntary and she may withdraw consent at any time for any reason by notifying study team. The decision to withdraw will not affect care the patient receives here. Study purpose and samples to be obtained were discussed     Subject was given time to review the consent form and to discuss participation in this study. Questions asked were answered to her satisfaction and subject voiced desire to sign consent today. Subject signed consent without coercion and undue influence. A copy of the signed consent was given to the subject. Contact information for the study team was given to the subject. Consent was scanned to the medical record.        No procedures took place prior to consenting.    Plan is to get sample at her next visit on 27 April 2021.

## 2021-03-16 NOTE — Progress Notes
Name: Kelsey [name]          MRN: [id number]      DOB: 1986/05/12      AGE: 35 y.o.   DATE OF SERVICE: 03/16/2021    Subjective:             Reason for Visit:  Follow Up    Interval history  The patient is here for follow-up with her husband.  She has been on pegylated interferon since August 22 at the dose of 45 mcg weekly.  The husband has been giving the doses.  He has been no treatment related complications.  She feels side effects 2 days after the injection that lasted for around 2 days and then completely resolved.  She denies depression or anxiety however she was started on Wellbutrin by her primary care physician for mood swings around her periods.  She did require a phlebotomy on August 31 but her counts today show anemia.  She has had no other treatment related side effects or complications.  She is up-to-date with health maintenance and follow-up with primary care physician.          Brief history  Patient is referred to hematology and oncology based on request of Dr. Donnajean Lopes  for second opinion regarding a recent diagnosis of an MPN.  Patient is 35 years old.  She is reportedly in good health.  She is married and has 4 children.  She follows with physicians infrequently.  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    Review of her hematologic history she was found to have high hemoglobin and platelet counts.  This was repeated and was confirmed on multiple occasions.  Serum erythropoietin level was suppressed.  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    The patient has had 1 phlebotomy so far which has reduced her hematocrit and hemoglobin and also have partially improved her symptoms    Patient has done multiple online support groups and is interested in interferon therapy in the future.    She is here with her husband for initial evaluation.  She is not in any pharmacotherapy.    Kelsey [name] is a 34 y.o. female.     Cancer Staging  No matching staging information was found for the patient.    History of Present Illness  Family Hx: Father-prostate cancer    MGM-ovarian cancer    MGF-brain tumor  Great aunt-breast cancer    Medical Hx: Depression/anxiety, COVID infection September 2021   Surgical Hx: Wisdom tooth extraction    Social Hx:  Married  Current smoker   Alcohol use    HPI:  Patient was seen for consult by hematology 04/23/2020 due to an elevated platelet count that first became evident in early November.  Patient had no previous history of thrombocytosis.   Patient's work up included additional labs and a BMBX.  She is a direct referral to Dr Molli Knock to discuss treatment options.       Records are in O2 including outside records that have been scanned into the chart.  Additional records are in Care Everywhere.     Timeline of Events:   DATES       04/15/2020 Labs CBC:  WBC 11.7, Hg 15.5, Hct 48.3,  platelets 817  04/17/2020 Labs  Peripheral blood:  Erythrocytosis and thrombocytosis    04/23/2020 Labs CBC:  WBc 10.2, Hg 14.8, Hct 47.4, platelets 888  Folate:  16.65  B12:  564  Iron studies:  Iron 62.00, TIBC 335.0, % Sat 18.51, Ferritin  32.0  LDH:  210  Erythropoietin:  2.4   05/27/2020 US-Abdomen complete  Impression:  Minimal hepatosplenomegaly just above normal limites.  Normal echogenicity of the liver and spleen.     06/11/2020 BMBX BM:  Normocellular bone with megakaryocytic hyperplasia and mild reticulin fibrosis.    FLOW:  No diagnostic immunophenotypic abnormalities detected.  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 Negative for activity change, appetite change, chills, fever and unexpected weight change.   HENT: Negative for congestion, mouth sores, sore throat, tinnitus and trouble swallowing.    Eyes: Positive for visual disturbance.   Respiratory: Negative for cough, chest tightness, shortness of breath and wheezing.    Cardiovascular: Negative for chest pain, palpitations and leg swelling.   Gastrointestinal: Negative for abdominal distention, abdominal pain, blood in stool, constipation, diarrhea, nausea and vomiting.   Genitourinary: Negative for difficulty urinating, dysuria, frequency and urgency.   Musculoskeletal: Negative for arthralgias, back pain and joint swelling.   Skin: Negative for rash.   Neurological: Positive for headaches. Negative for dizziness, weakness and numbness.   Hematological: Negative for adenopathy. Does not bruise/bleed easily.   Psychiatric/Behavioral: Negative for sleep disturbance. The patient is not nervous/anxious.          Objective:         ? aspirin EC 81 mg tablet Take 81 mg by mouth daily. Take with food.   ? buPROPion XL (WELLBUTRIN XL) 150 mg tablet    ? peginterferon alfa-2a (PEGASYS) 180 mcg/0.5 mL injection syringe Inject 0.125 mL under the skin every 7 days.     Vitals:    03/16/21 1037   BP: (!) 141/91   Pulse: 90   Temp: 36.9 ?C (98.4 ?F)   Resp: 16   SpO2: 100%   PainSc: Zero   Weight: 91.6 kg (202 lb)     Body mass index is 32.7 kg/m?Marland Kitchen     Pain Score: Zero       Fatigue Scale: 1    Pain Addressed:  N/A    Patient Evaluated for a Clinical Trial: No treatment clinical trial available for this patient.     [place] Cooperative Oncology Group performance status is 0, Fully active, able to carry on all pre-disease performance without restriction.Marland Kitchen     Physical Exam  Exam conducted with a chaperone present.   HENT:      Head: Normocephalic.      Mouth/Throat:      Pharynx: No oropharyngeal exudate.   Eyes:      General: No scleral icterus. Conjunctiva/sclera: Conjunctivae normal.      Pupils: Pupils are equal, round, and reactive to light.   Neck:      Thyroid: No thyromegaly.      Trachea: No tracheal deviation.   Cardiovascular:      Rate and Rhythm: Normal rate and regular rhythm.   Pulmonary:  Effort: Pulmonary effort is normal. No respiratory distress.      Breath sounds: Normal breath sounds. No wheezing or rales.   Abdominal:      General: Bowel sounds are normal. There is no distension.      Palpations: There is no mass.      Tenderness: There is no abdominal tenderness. There is no guarding or rebound.   Musculoskeletal:      Cervical back: Normal range of motion and neck supple.   Lymphadenopathy:      Cervical: No cervical adenopathy.   Skin:     Findings: No rash.   Neurological:      Mental Status: She is alert.      Cranial Nerves: No cranial nerve deficit.      Gait: Gait is intact.   Psychiatric:         Mood and Affect: Mood and affect normal.         Cognition and Memory: Memory normal.         Judgment: Judgment normal.               Assessment and Plan:    In regard to diagnosis:  Patient presents with significant erythrocytosis and panmyelosis.  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  Serum EPO level suppressed.  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  Thrombocytosis and leukocytosis can also be observed in PV.    The patient has high risk bone marrow features concerning for possible evidence of early myelofibrosis.  This is not associated with increased LDH, leukoerythroblastosis, or other high risk features at this time.  Repeat bone marrow biopsy at [facility] as possible dysplastic features, this is not clinically consistent with the patient's presentation with persistent panmyelosis and requirement for phlebotomy.  Her cytogenetics and molecular profile is not of high concern.  Future bone marrow biopsy will be required to further delineate disease evolution.    In regard to prognosis  I counseled the patient and her husband regarding the prognosis polycythemia vera which is associated with normal life expectancy to compared to peers,in general, however will require therapy to reduce disease related symptoms and disease related complications..  I also warned the patient about the risk of transformation to high risk disease in future.  We will continue to watch for evolution by serial bone marrow biopsies    In regard to goals of care  I emphasized the importance of symptom control as well as a hematocrit control to achieve a hematocrit goal of under 45% consistently.  Also discussed with the patient the goal of reducing thrombotic risk with aspirin.  Patient has researched interferon use and is interested in interferon therapy early on for as a disease modifying intervention.    The patient has achieved cytoreduction with phlebotomy however she continues to have significant symptom burden and poor quality of life.  She is interested in pharmacotherapy.  We discussed at length the use of hydroxyurea and interferon, mechanism of action, pros and cons, financial aspects and logistic aspects.  I have also discussed the collaboration with her primary oncologist locally given her long commute.  After discussion she opted to proceed with pegylated interferon therapy.  She signed informed consent for the interferon.  She met with the  pharmacist for further education.  We will start the patient on interferon in August after she returns from her vacation.    Treatment  Pegasys 45 mcg subcutaneously weekly Started 01/26/21 (first dose given in clinic.)  Pegasys 90 mcg subcutaneously weekly Started 03/16/21 (first dose given in clinic.)    The patient will continue interferon and increase the dose to 90 mcg weekly.  I asked her to would not undergo any more phlebotomies until she returns.  Educated patient and her husband regarding symptoms and signs of depression anxiety.    In regard to fertility:  The patient is not interested in future fertility, husband had permanent surgical contraception    In regard to health maintenance, survivorship  and primary prevention  ? The patient had abnormal breast mass for which she underwent imaging that is consistent with benign findings.  Exam her primary care and gynecology.  ? Recommended skin exam by dermatologist.  ? For the primary care prevention for cardiovascular risk reduction.      The patient and her husband had multiple inquiries that were answered to their satisfaction.  Thank you for involving [place] in the care of this pleasant patient      Total of 40 minutes were spent on the same day of the visit including preparing to see the patient, obtaining  and/or reviewing separately obtained history, performing a medically appropriate examination and/or  evaluation, counseling and educating the patient/family/caregiver, ordering medications, tests, or  procedures, referring and communication with other health care professionals, documenting clinical  information in the electronic or other health record, independently interpreting results and communicating  results to the patient/family/caregiver, and care coordination.

## 2021-03-16 NOTE — Patient Instructions
Your care team:     Dr. Abdulraheem Yacoub   Hematologist   Ola Kilanko, APRN   Nurse Practitioner   Clinical Nurse Coordinators (CNC's)   Laura Vaughan RN, BSN, Dom Haverland Rohrer RN, BSN, Meg Wolters RN      Phone numbers:     Scheduling # [phone]    CNCs Laura, Emie Sommerfeld & Meg  # [phone]  Messages left on nurses' line are checked Monday-Friday 8:00 AM - 4:00 PM.    Evening (after 4:00 PM), weekend and holiday on-call # [phone]   For urgent needs after hours, please ask for the oncologist on-call to be paged.   For urgent needs during business hours, please ask for Laura, Semya Klinke & Meg Dr. Yacoub's nurses, to be paged.    Notes:   - Allow one business week for our office to complete any requested paperwork (FMLA, etc.)   - Allow two to three business days for all medication refills. Please call your pharmacy first to check for available refills.

## 2021-03-17 ENCOUNTER — Encounter: Admit: 2021-03-17 | Discharge: 2021-03-17 | Payer: Private Health Insurance - Indemnity

## 2021-03-17 DIAGNOSIS — D45 Polycythemia vera: Secondary | ICD-10-CM

## 2021-03-17 MED ORDER — PEGASYS 180 MCG/0.5 ML SC SYRG
90 ug | SUBCUTANEOUS | 3 refills | Status: AC
Start: 2021-03-17 — End: ?

## 2021-04-21 ENCOUNTER — Encounter: Admit: 2021-04-21 | Discharge: 2021-04-21 | Payer: Private Health Insurance - Indemnity

## 2021-04-21 NOTE — Research Notes
I called patient to update her that we do not need a baseline sample for MPN-RC-106. I spoke to her husband because she was not available. Her husband said he would really the message. Patient will come to an appointment here at [facility] next week. I told him we appreciate her willingness to participate in the study.

## 2021-04-27 ENCOUNTER — Encounter: Admit: 2021-04-27 | Discharge: 2021-04-27 | Payer: Private Health Insurance - Indemnity

## 2021-04-27 DIAGNOSIS — D45 Polycythemia vera: Secondary | ICD-10-CM

## 2021-04-27 LAB — COMPREHENSIVE METABOLIC PANEL
ALBUMIN: 4.6 g/dL — ABNORMAL LOW (ref 3.5–5.0)
ALT: 14 U/L (ref 7–56)
ANION GAP: 9 K/UL (ref 3–12)
AST: 15 U/L (ref 7–40)
BLD UREA NITROGEN: 11 mg/dL — ABNORMAL LOW (ref 7–25)
CALCIUM: 9.3 mg/dL — ABNORMAL HIGH (ref >60)
CO2: 25 MMOL/L (ref 21–30)
CREATININE: 0.8 mg/dL — ABNORMAL HIGH (ref 0.4–1.00)
EGFR: >60 mL/min (ref >60)
GLUCOSE,PANEL: 115 mg/dL — ABNORMAL HIGH (ref 70–100)
SODIUM: 138 MMOL/L (ref 137–147)
TOTAL BILIRUBIN: 0.4 mg/dL (ref 0.3–1.2)
TOTAL PROTEIN: 7.4 g/dL (ref 6.0–8.0)

## 2021-04-27 LAB — THYROID STIMULATING HORMONE-TSH: TSH: 1.7 uU/mL — ABNORMAL LOW (ref 0.35–5.00)

## 2021-04-27 LAB — CBC AND DIFF
ABSOLUTE EOS COUNT: 0.4 K/UL (ref 0–0.45)
RBC COUNT: 5.4 M/UL — ABNORMAL HIGH (ref 4.0–5.0)
WBC COUNT: 9.3 K/UL (ref 4.5–11.0)

## 2021-04-27 LAB — LDH-LACTATE DEHYDROGENASE: LDH: 171 U/L (ref 100–210)

## 2021-04-27 NOTE — Patient Instructions
Your care team:     Dr. Abdulraheem Yacoub   Hematologist   Ola Kilanko, APRN   Nurse Practitioner   Clinical Nurse Coordinators (CNC's)   Willy Vorce RN, BSN, Grace Rohrer RN, BSN, Meg Wolters RN      Phone numbers:     Scheduling # [phone]    CNCs Bowden Boody, Grace & Meg  # [phone]  Messages left on nurses' line are checked Monday-Friday 8:00 AM - 4:00 PM.    Evening (after 4:00 PM), weekend and holiday on-call # [phone]   For urgent needs after hours, please ask for the oncologist on-call to be paged.   For urgent needs during business hours, please ask for Nalia Honeycutt, Grace & Meg Dr. Yacoub's nurses, to be paged.    Notes:   - Allow one business week for our office to complete any requested paperwork (FMLA, etc.)   - Allow two to three business days for all medication refills. Please call your pharmacy first to check for available refills.

## 2021-04-27 NOTE — Progress Notes
Date of Service: 04/27/2021    Name: Kelsey [name]  DOB: Jul 19, 1985  MRN: [id number]     Subjective:            Kelsey [name] is a 35 y.o. 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 The patient reports that she has been doing well since the recent dose increase. However, since then, she reports that she has had fatigue on Wed and Thursday after the weekly shot. She is still able to do all her ADLs without any issues. She notes some headaches and redness in her eyes over this last weekend which is still persistent. The patient otherwise feels well enough to start the higher dose of her interferon today.       History of Present Illness        Family Hx: Father-prostate cancer    MGM-ovarian cancer    MGF-brain tumor  Great aunt-breast cancer    Medical Hx: Depression/anxiety, COVID infection September 2021   Surgical Hx: Wisdom tooth extraction    Social Hx:  Married  Current smoker   Alcohol use    HPI:  Patient was seen for consult by hematology 04/23/2020 due to an elevated platelet count that first became evident in early November.  Patient had no previous history of thrombocytosis.   Patient's work up included additional labs and a BMBX.  She is a direct referral to Dr Molli Knock to discuss treatment options.     ?  Records are in O2 including outside records that have been scanned into the chart.  Additional records are in Care Everywhere.     Timeline of Events:   DATES  ? ? ?   04/15/2020 Labs CBC:  WBC 11.7, Hg 15.5, Hct 48.3,  platelets 817   04/17/2020 Labs  Peripheral blood:  Erythrocytosis and thrombocytosis    04/23/2020 Labs CBC:  WBc 10.2, Hg 14.8, Hct 47.4, platelets 888  Folate:  16.65  B12:  564  Iron studies:  Iron 62.00, TIBC 335.0, % Sat 18.51, Ferritin  32.0  LDH:  210  Erythropoietin:  2.4   05/27/2020 US-Abdomen complete  Impression:  Minimal hepatosplenomegaly just above normal limites.  Normal echogenicity of the liver and spleen.     06/11/2020 BMBX BM:  Normocellular bone with megakaryocytic hyperplasia and mild reticulin fibrosis.    FLOW:  No diagnostic immunophenotypic abnormalities detected.  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  Review of Systems   Constitutional: Positive for fatigue. Negative for activity change, appetite change, chills, fever and unexpected weight change.   HENT: Negative for congestion, mouth sores, sore throat, tinnitus and trouble swallowing.    Eyes: Positive for visual disturbance.   Respiratory: Negative for cough, chest tightness, shortness of breath and wheezing.    Cardiovascular: Negative for chest pain, palpitations and leg swelling.   Gastrointestinal: Negative for abdominal distention, abdominal pain, blood in stool, constipation, diarrhea, nausea and vomiting.   Genitourinary: Negative for difficulty urinating, dysuria, frequency and urgency.   Musculoskeletal: Negative for arthralgias, back pain and joint swelling.   Skin: Negative for rash.   Neurological: Positive for headaches. Negative for dizziness, weakness and numbness.   Hematological: Negative for adenopathy. Does  not bruise/bleed easily.   Psychiatric/Behavioral: Negative for sleep disturbance. The patient is not nervous/anxious.    ?    Objective:         ? aspirin EC 81 mg tablet Take 81 mg by mouth daily. Take with food.   ? buPROPion XL (WELLBUTRIN XL) 150 mg tablet    ? peginterferon alfa-2a (PEGASYS) 180 mcg/0.5 mL injection syringe Inject 0.25 mL under the skin every 7 days.     Vitals:    04/27/21 1310   BP: (!) 141/100   Pulse: 98   Temp: 36.3 ?C (97.3 ?F)   Resp: 16   SpO2: 100%   PainSc: Zero   Weight: 92.1 kg (203 lb)   Height: 167.4 cm (5' 5.91)     Body mass index is 32.86 kg/m?Marland Kitchen     Physical Exam:  General:  Alert, oriented x3, well-groomed, in no acute distress  HEENT: NC/AT. PERRLA, conjunctival erythema, EOMI, no scleral icterus. Oral MMM. No pharyngeal erythema or exudate. No cervical LAD. No thyromegaly or palpable nodules.   CV:  RRR. No murmurs, rubs, nor gallops.   Lungs:  CTAB. No wheeze, rales, nor rhonchi. Normal work of breathing  Abdomen:  Normoactive bowel sounds. Soft, non-distended, non-tender to palpation, no guarding or rigidity. No hepatosplenomegaly  Extremities:  No LE edema. 2+ posterior tibial pulses. No cyanosis or clubbing.   Skin:  No visible rash or lesions  MSK:  No joint swelling or tenderness  Neuro:  5/5 UE and LE motor strength. Sensation intact. No gross focal deficits.   Psych:  Normal mood and affect    Assessment and Plan:  ?  In regard to diagnosis:  Patient presents with significant erythrocytosis and panmyelosis.  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  Serum EPO level suppressed.  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  Thrombocytosis and leukocytosis can also be observed in PV.  ?  The patient has high risk bone marrow features concerning for possible evidence of early myelofibrosis.  This is not associated with increased LDH, leukoerythroblastosis, or other high risk features at this time.  Repeat bone marrow biopsy at [facility] as possible dysplastic features, this is not clinically consistent with the patient's presentation with persistent panmyelosis and requirement for phlebotomy.  Her cytogenetics and molecular profile is not of high concern.  Future bone marrow biopsy will be required to further delineate disease evolution.  ?  In regard to prognosis  I counseled the patient and her husband regarding the prognosis polycythemia vera which is associated with normal life expectancy to compared to peers,in general, however will require therapy to reduce disease related symptoms and disease related complications..  I also warned the patient about the risk of transformation to high risk disease in future.  We will continue to watch for evolution by serial bone marrow biopsies  ?  In regard to goals of care  I emphasized the importance of symptom control as well as a hematocrit control to achieve a hematocrit goal of under 45% consistently.  Also discussed with the patient the goal of reducing thrombotic risk with aspirin.  Patient has researched interferon use and is interested in interferon therapy early on for as a disease modifying intervention.  ?  The patient has  achieved cytoreduction with phlebotomy however she continues to have significant symptom burden and poor quality of life.  She is interested in pharmacotherapy.  We discussed at length the use of hydroxyurea and interferon, mechanism of action, pros and cons, financial aspects and logistic aspects.  I have also discussed the collaboration with her primary oncologist locally given her long commute.  After discussion she opted to proceed with pegylated interferon therapy.  She signed informed consent for the interferon.  She met with the pharmacist for further education.  We started the patient on interferon in August after she returned from her vacation.  ?  Treatment  Pegasys 45 mcg subcutaneously weekly Started 01/26/21 (first dose given in clinic.)  Pegasys 90 mcg subcutaneously weekly Started 03/16/21 (first dose given in clinic.)  Continue 90 mcg once on 04/27/2021 and then increase to 135 mcg from 05/04/2021  ?  Educated patient and her husband regarding symptoms and signs of depression anxiety.    In regards to headaches and hypertension:  Patient reporting significant headaches prior to diagnosis which have improved since phlebotomies were started and initiation of interferon. The patient believes that her headaches do correlate with higher hematocrit values. Her blood pressures were elevated in clinic today initially 141/100 mm Hg then improved to 126/96 mm Hg which could be contributing. Encouraged patient to monitor blood pressures at home with cuff and also encouraged to follow with PCP to consider medical management in addition to lifestyle management. Patient should also avoid using NSAIDs as much as possible. Given her age, we will plan for renal ultrasound with Doppler in Jan 2023 to rule our RAS. We will hold off on MRI Brain for chronic headaches as there is a clear correlation with hematocrit and phlebotomies, absence of focal signs.  ?  In regard to fertility:  The patient is not interested in future fertility, husband had permanent surgical contraception  ?  In regard to health maintenance, survivorship  and primary prevention  ? The patient had abnormal breast mass for which she underwent imaging that is consistent with benign findings.  Exam her primary care and gynecology.  ? Recommended skin exam by dermatologist.  ? For the primary care prevention for cardiovascular risk reduction.  ?  ?  The patient and her husband had multiple inquiries that were answered to their satisfaction.  Thank you for involving [place] in the care of this pleasant patient    RTC in Jan 2023    Dr. Clinton Sawyer, MBBS  Internal Medicine, PGY-2  Available on Voalte  Pager: [phone]    Patient discussed with Dr. Molli Knock

## 2021-04-27 NOTE — Progress Notes
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  Cycle 1 start date was 01/26/21. Kelsey [name] confirms she is taking the medication as prescribed - 90 mcg sq every 7 days.     Adverse Effects and Adherence Assessment   Kelsey [name] is not experiencing any significant adverse effects to this medication regimen.     The patient's ability to self-administer medication was re-assessed. The patient has the ability to self-administer this medication.  Kelsey [name] reports missing 0 doses in the past 3 month(s) of therapy. she was re-educated on importance of adherence.    Dose Appropriateness Assessment  No renal or hepatic dose adjustment is required at this time.  No dose adjustments based on toxicity are required at this time.  and treatment will continue until progression or unacceptable toxicity.        CBC w diff    Lab Results   Component Value Date/Time    WBC 9.3 04/27/2021 11:50 AM    RBC 5.40 (H) 04/27/2021 11:50 AM    HGB 13.2 04/27/2021 11:50 AM    HCT 41.5 04/27/2021 11:50 AM    MCV 76.8 (L) 04/27/2021 11:50 AM    MCH 24.4 (L) 04/27/2021 11:50 AM    MCHC 31.7 (L) 04/27/2021 11:50 AM    RDW 15.5 (H) 04/27/2021 11:50 AM    PLTCT 794 (H) 04/27/2021 11:50 AM    MPV 7.8 04/27/2021 11:50 AM    Lab Results   Component Value Date/Time    NEUT 68 04/27/2021 11:50 AM    ANC 6.40 04/27/2021 11:50 AM    LYMA 20 (L) 04/27/2021 11:50 AM    ALC 1.90 04/27/2021 11:50 AM    MONA 6 04/27/2021 11:50 AM    AMC 0.50 04/27/2021 11:50 AM    EOSA 4 04/27/2021 11:50 AM    AEC 0.40 04/27/2021 11:50 AM    BASA 2 04/27/2021 11:50 AM    ABC 0.10 04/27/2021 11:50 AM        Comprehensive Metabolic Profile    Lab Results   Component Value Date/Time    NA 138 04/27/2021 11:50 AM    K 3.7 04/27/2021 11:50 AM    CL 104 04/27/2021 11:50 AM    CO2 25 04/27/2021 11:50 AM    GAP 9 04/27/2021 11:50 AM    BUN 11 04/27/2021 11:50 AM    CR 0.84 04/27/2021 11:50 AM    GLU 115 (H) 04/27/2021 11:50 AM    Lab Results   Component Value Date/Time    CA 9.3 04/27/2021 11:50 AM    ALBUMIN 4.6 04/27/2021 11:50 AM    TOTPROT 7.4 04/27/2021 11:50 AM    ALKPHOS 44 04/27/2021 11:50 AM    AST 15 04/27/2021 11:50 AM    ALT 14 04/27/2021 11:50 AM    TOTBILI 0.4 04/27/2021 11:50 AM            Serum creatinine: 0.84 mg/dL 41/32/44 0102  Estimated creatinine clearance: 108.7 mL/min    Medication Reconciliation and Drug/Food Interaction Assessment  A comprehensive medication reconciliation was performed for Kelsey [name]. her medication list was updated to reflect any identified changes and the patient?s current medication list is included below. Kelsey [name] was instructed to speak with her healthcare provider and/or the oral chemotherapy pharmacist before starting any new drug, including, but not limited to, prescription, over the counter, natural / herbal products, or vitamins and supplements.  Drug-drug and drug-food interactions were assessed between the patients? specialty medication(s) and her most current medication list. No significant drug-drug or drug-food interactions were identified.     Home Medications    Medication Sig   aspirin EC 81 mg tablet Take 81 mg by mouth daily. Take with food.   buPROPion XL (WELLBUTRIN XL) 150 mg tablet    peginterferon alfa-2a (PEGASYS) 180 mcg/0.5 mL injection syringe Inject 0.25 mL under the skin every 7 days.       No Known Allergies    Response to Therapy Assessment  The electronic medical record for Kelsey [name] has been reviewed. The patient?s provider has determined this therapy is appropriate to continue at this time. The patient will continue this therapy as she is achieving therapeutic benefit.     Immunization Assessment     There is no immunization history on file for this patient.    Vaccine history and recommended vaccinations were reviewed and will be recommended as appropriate. The patient will be reminded about the importance of receiving an annual influenza vaccine as indicated.      Safety Assessment  Risk Evaluation and Mitigation Strategy (REMS)  No REMS is required for this medication.    Reproductive Risk  Kelsey [name] is a 35 y.o. female. As patient is a female of child-bearing potential, she was instructed regarding effective contraception during and after treatment, and that in the event that she becomes pregnant she should contact her physician immediately.     Handling and Disposal  Appropriate safe handling and disposal procedures were reviewed with the patient. Kelsey [name] was instructed to return any unused or expired oral chemotherapy medication to a designated disposal bin at one of the [facility] locations or to utilize a community drug take back program. Instructed not to flush down the toilet or to crush the medication    Follow-up Plan   Kelsey [name] was encouraged to call the oral chemotherapy pharmacist at [phone] with questions. This medication is considered medium risk per our internal oral chemotherapy risk categorization. This re-assessment has been completed and the next reassessment planned for 12 months.    Kelsey [name], [facility]  Oncology Clinical Pharmacist  04/27/2021

## 2021-04-28 ENCOUNTER — Encounter: Admit: 2021-04-28 | Discharge: 2021-04-28 | Payer: Private Health Insurance - Indemnity

## 2021-06-11 ENCOUNTER — Encounter: Admit: 2021-06-11 | Discharge: 2021-06-11 | Payer: Private Health Insurance - Indemnity

## 2021-06-11 ENCOUNTER — Ambulatory Visit: Admit: 2021-06-11 | Discharge: 2021-06-11 | Payer: Private Health Insurance - Indemnity

## 2021-06-11 DIAGNOSIS — D45 Polycythemia vera: Secondary | ICD-10-CM

## 2021-06-11 LAB — COMPREHENSIVE METABOLIC PANEL
ALBUMIN: 4.6 g/dL — ABNORMAL LOW (ref 3.5–5.0)
ALK PHOSPHATASE: 49 U/L (ref 25–110)
ANION GAP: 6 K/UL (ref 3–12)
AST: 12 U/L (ref 7–40)
CALCIUM: 9.7 mg/dL — ABNORMAL HIGH (ref 8.5–10.6)
CREATININE: 1 mg/dL — ABNORMAL HIGH (ref 0.4–1.00)
EGFR: >60 mL/min (ref >60)
GLUCOSE,PANEL: 113 mg/dL — ABNORMAL HIGH (ref 70–100)
SODIUM: 137 MMOL/L (ref 137–147)
TOTAL BILIRUBIN: 0.4 mg/dL (ref 0.3–1.2)
TOTAL PROTEIN: 7.7 g/dL (ref 6.0–8.0)

## 2021-06-11 LAB — CBC AND DIFF
ABSOLUTE BASO COUNT: 0.1 K/UL (ref 0–0.20)
ABSOLUTE EOS COUNT: 0.3 K/UL (ref 0–0.45)
ABSOLUTE NEUTROPHIL: 5.4 K/UL (ref 1.8–7.0)
BASOPHILS %: 1 % (ref 0–2)
HEMATOCRIT: 43 % — ABNORMAL HIGH (ref 36–45)
MCHC: 32 g/dL (ref 32.0–36.0)
MCV: 75 FL — ABNORMAL LOW (ref 80–100)
RBC COUNT: 5.7 M/UL — ABNORMAL HIGH (ref 4.0–5.0)
WBC COUNT: 7.7 K/UL (ref 4.5–11.0)

## 2021-06-11 NOTE — Patient Instructions
Your care team:     Dr. Abdulraheem Yacoub   Hematologist   Ola Kilanko, APRN   Nurse Practitioner   Clinical Nurse Coordinators (CNC's)   Laura Vaughan RN, BSN, Koree Schopf Rohrer RN, BSN, Meg Wolters RN      Phone numbers:     Scheduling # [phone]    CNCs Laura, Jericca Russett & Meg  # [phone]  Messages left on nurses' line are checked Monday-Friday 8:00 AM - 4:00 PM.    Evening (after 4:00 PM), weekend and holiday on-call # [phone]   For urgent needs after hours, please ask for the oncologist on-call to be paged.   For urgent needs during business hours, please ask for Laura, Robertson Colclough & Meg Dr. Yacoub's nurses, to be paged.    Notes:   - Allow one business week for our office to complete any requested paperwork (FMLA, etc.)   - Allow two to three business days for all medication refills. Please call your pharmacy first to check for available refills.

## 2021-06-12 ENCOUNTER — Encounter: Admit: 2021-06-12 | Discharge: 2021-06-12 | Payer: Private Health Insurance - Indemnity

## 2021-06-12 DIAGNOSIS — D45 Polycythemia vera: Secondary | ICD-10-CM

## 2021-06-15 ENCOUNTER — Encounter: Admit: 2021-06-15 | Discharge: 2021-06-15 | Payer: Private Health Insurance - Indemnity

## 2021-06-15 DIAGNOSIS — D45 Polycythemia vera: Secondary | ICD-10-CM

## 2021-06-15 MED ORDER — PEGASYS 180 MCG/0.5 ML SC SYRG
3 refills
Start: 2021-06-15 — End: ?

## 2021-06-17 ENCOUNTER — Encounter: Admit: 2021-06-17 | Discharge: 2021-06-17 | Payer: Private Health Insurance - Indemnity

## 2021-06-29 ENCOUNTER — Encounter: Admit: 2021-06-29 | Discharge: 2021-06-29 | Payer: Private Health Insurance - Indemnity

## 2021-06-30 ENCOUNTER — Encounter: Admit: 2021-06-30 | Discharge: 2021-06-30 | Payer: Private Health Insurance - Indemnity

## 2021-06-30 DIAGNOSIS — D45 Polycythemia vera: Secondary | ICD-10-CM

## 2021-06-30 MED ORDER — PEGASYS 180 MCG/0.5 ML SC SYRG
135 ug | SUBCUTANEOUS | 3 refills | Status: AC
Start: 2021-06-30 — End: ?

## 2021-06-30 NOTE — Progress Notes
Name:Kelsey [name]           MRN: [id number]                 DOB:[birth year]          Age: 36 y.o.  Date of Service: 06/30/2021      Oral Chemotherapy Refill Review     Patient Information:     Correct patient/DOB/Diagnosis: Yes    Reviewed most recent clinic notes for changes in plan: Yes    Most recent labs reviewed: Yes    Appointment Information:   Date of last appointment:06/11/2021    Follow up appointment scheduled : Yes 09/07/2021      Medication Information:     Medication name: pegasys    Medication in [place]: Yes     Date of last refill released from [facility]: 03/17/2021    Weight change >03%: Not applicable    Has a dose adjustment been made since last refill? Yes    Is Pharmacy listed in [place] treatment plan? Yes    Pharmacy: [email or web address]    Consent Date 11/17/2020    Patient Contact  Method of communication: MyChart  Confirmed need for refill: Yes  Patient taking medication as directed Yes  Confirmation of receiving pharmacy: Yes  Patient is anticipating a start date of: continuation of therapy    Other Information:     Plan was routed to Dr Nelda Marseille

## 2021-07-22 ENCOUNTER — Encounter: Admit: 2021-07-22 | Discharge: 2021-07-22 | Payer: Private Health Insurance - Indemnity

## 2021-09-04 ENCOUNTER — Encounter: Admit: 2021-09-04 | Discharge: 2021-09-04 | Payer: Private Health Insurance - Indemnity

## 2021-09-07 ENCOUNTER — Encounter: Admit: 2021-09-07 | Discharge: 2021-09-07 | Payer: Private Health Insurance - Indemnity

## 2021-09-07 DIAGNOSIS — D45 Polycythemia vera: Secondary | ICD-10-CM

## 2021-09-07 LAB — COMPREHENSIVE METABOLIC PANEL
ALBUMIN: 4.6 g/dL (ref 3.5–5.0)
ALK PHOSPHATASE: 49 U/L — ABNORMAL LOW (ref 25–110)
ALT: 13 U/L (ref 7–56)
ANION GAP: 6 K/UL (ref 3–12)
AST: 13 U/L (ref 7–40)
BLD UREA NITROGEN: 11 mg/dL (ref 7–25)
CALCIUM: 9.3 mg/dL — ABNORMAL HIGH (ref 8.5–10.6)
CO2: 28 MMOL/L (ref 21–30)
CREATININE: 1 mg/dL (ref 0.4–1.00)
EGFR: >60 mL/min (ref >60)
GLUCOSE,PANEL: 102 mg/dL — ABNORMAL HIGH (ref 70–100)
SODIUM: 139 MMOL/L — ABNORMAL HIGH (ref 137–147)
TOTAL BILIRUBIN: 0.4 mg/dL (ref 0.3–1.2)
TOTAL PROTEIN: 7.5 g/dL — ABNORMAL HIGH (ref 6.0–8.0)

## 2021-09-07 LAB — CBC AND DIFF
ABSOLUTE BASO COUNT: 0.1 K/UL (ref 0–0.20)
ABSOLUTE EOS COUNT: 0.3 K/UL (ref 0–0.45)
ABSOLUTE MONO COUNT: 0.6 K/UL (ref 0–0.80)
WBC COUNT: 7.9 K/UL (ref 4.5–11.0)

## 2021-09-07 LAB — LDH-LACTATE DEHYDROGENASE: LDH: 226 U/L — ABNORMAL HIGH (ref 100–210)

## 2021-09-07 LAB — THYROID STIMULATING HORMONE-TSH: TSH: 1.2 uU/mL (ref 0.35–5.00)

## 2021-09-07 NOTE — Assessment & Plan Note
Kelsey [name] is a 36 y.o. female who presents for follow up for PV.       In regard to diagnosis:  Patient presents with significant erythrocytosis and panmyelosis.  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  Serum EPO level suppressed.  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  Thrombocytosis and leukocytosis can also be observed in PV.  ?  The patient has high risk bone marrow features concerning for possible evidence of early myelofibrosis.  This is not associated with increased LDH, leukoerythroblastosis, or other high risk features at this time.  Repeat bone marrow biopsy at [facility] as possible dysplastic features, this is not clinically consistent with the patient's presentation with persistent panmyelosis and requirement for phlebotomy.  Her cytogenetics and molecular profile is not of high concern.  Future bone marrow biopsy will be required to further delineate disease evolution.  ?  In regard to prognosis  I counseled the patient and her husband regarding the prognosis polycythemia vera which is associated with normal life expectancy to compared to peers,in general, however will require therapy to reduce disease related symptoms and disease related complications..  I also warned the patient about the risk of transformation to high risk disease in future.  We will continue to watch for evolution by serial bone marrow biopsies  ?  In regard to goals of care  I emphasized the importance of symptom control as well as a hematocrit control to achieve a hematocrit goal of under 45% consistently.  Also discussed with the patient the goal of reducing thrombotic risk with aspirin.  Patient has researched interferon use and is interested in interferon therapy early on for as a disease modifying intervention.  ?  The patient has achieved cytoreduction with phlebotomy however she continues to have significant symptom burden and poor quality of life.  She is interested in pharmacotherapy.  We discussed at length the use of hydroxyurea and interferon, mechanism of action, pros and cons, financial aspects and logistic aspects.  I have also discussed the collaboration with her primary oncologist locally given her long commute.  After discussion she opted to proceed with pegylated interferon therapy.  She signed informed consent for the interferon.  She met with the pharmacist for further education.  We started the patient on interferon in August after she returned from her vacation.  ?  Treatment  Pegasys 45 mcg subcutaneously weekly Started 01/26/21   Pegasys 90 mcg subcutaneously weekly Started 03/16/21    Pegasys 135 mcg subcutaneously weekly Started 04/27/2021  ?  Educated patient and her husband regarding symptoms and signs of depression anxiety.    The patient has had successful therapy without need for therapeutic phlebotomy for several months.  Her white count is normal.  Her blood counts have not changed with therapy.  The patient declined a dose escalation given concerns for side effects which she would like to continue at the dose of 135 mcg subcutaneously weekly.    She will return in August with repeat labs and repeat check to be AF.  Can consider repeat bone marrow biopsy in the future given the dysplastic features on the original bone marrow biopsy.  ?  In regard to fertility:  The patient is not interested in future fertility, husband had permanent surgical  contraception  ?  In regard to health maintenance, survivorship  and primary prevention  ? The patient had abnormal breast mass for which she underwent imaging that is consistent with benign findings.  Exam her primary care and gynecology.  This is how she presented to hematology oncology and this led to diagnosis of her MPN  ? Recommended skin exam by dermatologist.  ? For the primary care prevention for cardiovascular risk reduction.  ? Is not clear if changes in renal function or hypertension is related to therapy.  This is being managed by her local physicians.  ?

## 2021-09-07 NOTE — Progress Notes
Date of Service: 09/07/2021    Name: Kelsey [name]  DOB: 1985/09/21  MRN: [id number]     Subjective:            Kelsey [name] is a 36 y.o. 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 The patient reports that she has been doing well since the recent dose increase. However, since then, she reports that she has had fatigue on Wed and Thursday after the weekly shot. She is still able to do all her ADLs without any issues. She notes some headaches and redness in her eyes over this last weekend which is still persistent. The patient otherwise feels well enough to start the higher dose of her interferon today.     Interval History:  Kelsey [name] presents today for follow-up. She is here with her husband. She overall feels well. She has been taking Pegasys 135 mcg weekly since 04/27/21.   She reports occasional fatigue and occasional nausea but no other prohibitive side effects.    She has not require phlebotomy since August 2022        History of Present Illness        Family Hx: Father-prostate cancer    MGM-ovarian cancer    MGF-brain tumor  Great aunt-breast cancer    Medical Hx: Depression/anxiety, COVID infection September 2021   Surgical Hx: Wisdom tooth extraction    Social Hx:  Married  Current smoker   Alcohol use    HPI:  Patient was seen for consult by hematology 04/23/2020 due to an elevated platelet count that first became evident in early November.  Patient had no previous history of thrombocytosis.   Patient's work up included additional labs and a BMBX.  She is a direct referral to Dr Molli Knock to discuss treatment options.     ?  Records are in [facility] including outside records that have been scanned into the chart.  Additional records are in Care Everywhere.     Timeline of Events:   DATES  ? ? ?   04/15/2020 Labs CBC:  WBC 11.7, Hg 15.5, Hct 48.3,  platelets 817   04/17/2020 Labs  Peripheral blood:  Erythrocytosis and thrombocytosis    04/23/2020 Labs CBC:  WBc 10.2, Hg 14.8, Hct 47.4, platelets 888  Folate: 16.65  B12:  564  Iron studies:  Iron 62.00, TIBC 335.0, % Sat 18.51, Ferritin  32.0  LDH:  210  Erythropoietin:  2.4   05/27/2020 US-Abdomen complete  Impression:  Minimal hepatosplenomegaly just above normal limites.  Normal echogenicity of the liver and spleen.     06/11/2020 BMBX BM:  Normocellular bone with megakaryocytic hyperplasia and mild reticulin fibrosis.    FLOW:  No diagnostic immunophenotypic abnormalities detected.  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  Review of Systems   Constitutional: Positive for fatigue. Negative for activity change, appetite change, chills, fever and unexpected weight change.   HENT: Negative for congestion, mouth sores, sore throat, tinnitus and trouble swallowing.    Eyes: Positive for visual disturbance.   Respiratory: Negative for cough, chest tightness, shortness of breath and wheezing.    Cardiovascular: Negative for chest pain, palpitations and leg swelling.   Gastrointestinal: Negative for abdominal  distention, abdominal pain, blood in stool, constipation, diarrhea, nausea and vomiting.   Genitourinary: Negative for difficulty urinating, dysuria, frequency and urgency.   Musculoskeletal: Negative for arthralgias, back pain and joint swelling.   Skin: Negative for rash.   Neurological: Positive for headaches. Negative for dizziness, weakness and numbness.   Hematological: Negative for adenopathy. Does not bruise/bleed easily.   Psychiatric/Behavioral: Negative for sleep disturbance. The patient is not nervous/anxious.    ?    Objective:         ? aspirin EC 81 mg tablet Take one tablet by mouth daily. Take with food.   ? buPROPion XL (WELLBUTRIN XL) 150 mg tablet    ? dilTIAZem CD (CARDIZEM CD) 120 mg capsule    ? peginterferon alfa-2a (PEGASYS) 180 mcg/0.5 mL injection syringe Inject 0.375 mL under the skin every 7 days.     Vitals:    09/07/21 1333   BP: (!) 135/95   Pulse: 85   Temp: 36.7 ?C (98.1 ?F)   Resp: 16   SpO2: 99%   PainSc: Zero   Weight: 91.6 kg (202 lb)     Body mass index is 32.7 kg/m?Marland Kitchen     Physical Exam:  General:  Alert, oriented x3, well-groomed, in no acute distress  HEENT:  NC/AT. PERRLA, conjunctival erythema, EOMI, no scleral icterus. Oral MMM. No pharyngeal erythema or exudate. No cervical LAD. No thyromegaly or palpable nodules.   CV:  RRR. No murmurs, rubs, nor gallops.   Lungs:  CTAB. No wheeze, rales, nor rhonchi. Normal work of breathing  Abdomen:  Normoactive bowel sounds. Soft, non-distended, non-tender to palpation, no guarding or rigidity. No hepatosplenomegaly  Extremities:  No LE edema. 2+ posterior tibial pulses. No cyanosis or clubbing.   Skin:  No visible rash or lesions  MSK:  No joint swelling or tenderness  Neuro:  5/5 UE and LE motor strength. Sensation intact. No gross focal deficits.   Psych:  Normal mood and affect    Assessment and Plan:  ?  ?  Polycythemia vera (HCC)  Kelsey [name] is a 36 y.o. female who presents for follow up for PV.       In regard to diagnosis:  Patient presents with significant erythrocytosis and panmyelosis.  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  Serum EPO level suppressed.  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  Thrombocytosis and leukocytosis can also be observed in PV.  ?  The patient has high risk bone marrow features concerning for possible evidence of early myelofibrosis.  This is not associated with increased LDH, leukoerythroblastosis, or other high risk features at this time.  Repeat bone marrow biopsy at [facility] as possible dysplastic features, this is not clinically consistent with the patient's presentation with persistent panmyelosis and requirement for phlebotomy.  Her cytogenetics and molecular profile is not of high concern.  Future bone marrow biopsy will be required to further delineate disease evolution.  ?  In regard to prognosis  I counseled the patient and her husband regarding the prognosis polycythemia vera which is associated with normal life expectancy to compared to peers,in general, however will require therapy to reduce disease related symptoms and disease related complications..  I also warned the patient about the risk of transformation to high  risk disease in future.  We will continue to watch for evolution by serial bone marrow biopsies  ?  In regard to goals of care  I emphasized the importance of symptom control as well as a hematocrit control to achieve a hematocrit goal of under 45% consistently.  Also discussed with the patient the goal of reducing thrombotic risk with aspirin.  Patient has researched interferon use and is interested in interferon therapy early on for as a disease modifying intervention.  ?  The patient has achieved cytoreduction with phlebotomy however she continues to have significant symptom burden and poor quality of life.  She is interested in pharmacotherapy.  We discussed at length the use of hydroxyurea and interferon, mechanism of action, pros and cons, financial aspects and logistic aspects.  I have also discussed the collaboration with her primary oncologist locally given her long commute.  After discussion she opted to proceed with pegylated interferon therapy.  She signed informed consent for the interferon.  She met with the pharmacist for further education.  We started the patient on interferon in August after she returned from her vacation.  ?  Treatment  Pegasys 45 mcg subcutaneously weekly Started 01/26/21   Pegasys 90 mcg subcutaneously weekly Started 03/16/21    Pegasys 135 mcg subcutaneously weekly Started 04/27/2021  ?  Educated patient and her husband regarding symptoms and signs of depression anxiety.    The patient has had successful therapy without need for therapeutic phlebotomy for several months.  Her white count is normal.  Her blood counts have not changed with therapy.  The patient declined a dose escalation given concerns for side effects which she would like to continue at the dose of 135 mcg subcutaneously weekly.    She will return in August with repeat labs and repeat check to be AF.  Can consider repeat bone marrow biopsy in the future given the dysplastic features on the original bone marrow biopsy.  ?  In regard to fertility:  The patient is not interested in future fertility, husband had permanent surgical contraception  ?  In regard to health maintenance, survivorship  and primary prevention  ? The patient had abnormal breast mass for which she underwent imaging that is consistent with benign findings.  Exam her primary care and gynecology.  This is how she presented to hematology oncology and this led to diagnosis of her MPN  ? Recommended skin exam by dermatologist.  ? For the primary care prevention for cardiovascular risk reduction.  ? Is not clear if changes in renal function or hypertension is related to therapy.  This is being managed by her local physicians.  ?        Jonni Sanger, MD,       Total of 35 minutes were spent on the same day of the visit including preparing to see the patient, obtaining  and/or reviewing separately obtained history, performing a medically appropriate examination and/or  evaluation, counseling and educating the patient/family/caregiver, ordering medications, tests, or  procedures, referring and communication with other health care professionals, documenting clinical  information in the electronic or other health record, independently interpreting results and communicating  results to the patient/family/caregiver, and care coordination.

## 2021-09-07 NOTE — Patient Instructions
Your care team:     Dr. Abdulraheem Yacoub   Hematologist   Ola Kilanko, APRN   Nurse Practitioner   Clinical Nurse Coordinators (CNC's)   Laura Vaughan RN, BSN, Rosela Supak Rohrer RN, BSN, Meg Wolters RN      Phone numbers:     Scheduling # [phone]    CNCs Laura, Kiing Deakin & Meg  # [phone]  Messages left on nurses' line are checked Monday-Friday 8:00 AM - 4:00 PM.    Evening (after 4:00 PM), weekend and holiday on-call # [phone]   For urgent needs after hours, please ask for the oncologist on-call to be paged.   For urgent needs during business hours, please ask for Laura, Marleni Gallardo & Meg Dr. Yacoub's nurses, to be paged.    Notes:   - Allow one business week for our office to complete any requested paperwork (FMLA, etc.)   - Allow two to three business days for all medication refills. Please call your pharmacy first to check for available refills.

## 2021-09-23 ENCOUNTER — Encounter: Admit: 2021-09-23 | Discharge: 2021-09-23 | Payer: Private Health Insurance - Indemnity

## 2021-09-23 DIAGNOSIS — D45 Polycythemia vera: Secondary | ICD-10-CM

## 2021-09-23 MED ORDER — PEGASYS 180 MCG/0.5 ML SC SYRG
3 refills
Start: 2021-09-23 — End: ?

## 2021-09-29 ENCOUNTER — Encounter: Admit: 2021-09-29 | Discharge: 2021-09-29 | Payer: Private Health Insurance - Indemnity

## 2021-09-29 DIAGNOSIS — D45 Polycythemia vera: Secondary | ICD-10-CM

## 2021-09-29 MED ORDER — PEGASYS 180 MCG/0.5 ML SC SYRG
135 ug | SUBCUTANEOUS | 3 refills | Status: AC
Start: 2021-09-29 — End: ?

## 2021-09-29 MED ORDER — PEGASYS 180 MCG/0.5 ML SC SYRG
135 ug | SUBCUTANEOUS | 3 refills | Status: CN
Start: 2021-09-29 — End: ?

## 2021-09-29 NOTE — Progress Notes
Name:Tomicka Mairen Wallenstein           MRN: [id number]                 DOB:[birth year]          Age: 36 y.o.  Date of Service: 09/29/2021      Oral Chemotherapy Refill Review     Patient Information:     Correct patient/DOB/Diagnosis: Yes    Reviewed most recent clinic notes for changes in plan: Yes    Most recent labs reviewed: Yes    Appointment Information:   Date of last appointment:  09/07/2021    Follow up appointment scheduled : Yes 01/11/2022      Medication Information:     Medication name: Pegasys     Medication in [place]: Yes     Date of last refill released from [facility]: 06/30/21    Weight change >10%: No    Has a dose adjustment been made since last refill?Yes    Is Pharmacy listed in [place] treatment plan? Yes    Pharmacy: [facility]    Consent Date 11/17/2021    Patient Contact  Method of communication: MyChart  Confirmed need for refill: Yes  Patient taking medication as directed Yes  Confirmation of receiving pharmacy: Yes  Patient is anticipating a start date of: Continuation of therapy     Other Information:     Plan was routed to Glendon Axe, APRN

## 2021-10-20 ENCOUNTER — Encounter: Admit: 2021-10-20 | Discharge: 2021-10-20 | Payer: Private Health Insurance - Indemnity

## 2021-11-13 ENCOUNTER — Encounter: Admit: 2021-11-13 | Discharge: 2021-11-13 | Payer: Private Health Insurance - Indemnity

## 2021-11-13 NOTE — Progress Notes
The Prior Authorization for Pegasy has been submitted for Sibyl Parr via Cover My Meds.  Will continue to follow.    [place] Patient Advocate  [phone]

## 2021-11-16 ENCOUNTER — Encounter: Admit: 2021-11-16 | Discharge: 2021-11-16 | Payer: Private Health Insurance - Indemnity

## 2021-11-17 ENCOUNTER — Encounter: Admit: 2021-11-17 | Discharge: 2021-11-17 | Payer: Private Health Insurance - Indemnity

## 2021-11-26 ENCOUNTER — Encounter: Admit: 2021-11-26 | Discharge: 2021-11-26 | Payer: Private Health Insurance - Indemnity

## 2021-12-01 ENCOUNTER — Encounter: Admit: 2021-12-01 | Discharge: 2021-12-01 | Payer: Private Health Insurance - Indemnity

## 2021-12-31 ENCOUNTER — Encounter: Admit: 2021-12-31 | Discharge: 2021-12-31 | Payer: Private Health Insurance - Indemnity

## 2021-12-31 DIAGNOSIS — D45 Polycythemia vera: Secondary | ICD-10-CM

## 2021-12-31 MED ORDER — PEGASYS 180 MCG/0.5 ML SC SYRG
3 refills
Start: 2021-12-31 — End: ?

## 2022-01-04 ENCOUNTER — Encounter: Admit: 2022-01-04 | Discharge: 2022-01-04 | Payer: Private Health Insurance - Indemnity

## 2022-01-04 DIAGNOSIS — D45 Polycythemia vera: Secondary | ICD-10-CM

## 2022-01-04 MED ORDER — PEGASYS 180 MCG/0.5 ML SC SYRG
135 ug | SUBCUTANEOUS | 3 refills | Status: AC
Start: 2022-01-04 — End: ?

## 2022-01-04 MED ORDER — PEGASYS 180 MCG/0.5 ML SC SYRG
135 ug | SUBCUTANEOUS | 3 refills | Status: CN
Start: 2022-01-04 — End: ?

## 2022-01-04 NOTE — Progress Notes
The Prior Authorization for Pegasys was approved for Kelsey [name] from 11/13/2021 "until further notice".      [place] Patient Advocate  [phone]

## 2022-01-04 NOTE — Oral Chemotherapy Note
Name:Contrina Ainsley Deakins           MRN: [id number]                 DOB:[birth year]          Age: 36 y.o.  Date of Service: 01/04/22      Oral Chemotherapy Refill Review     Patient Information:     Correct patient/DOB/Diagnosis: Yes    Reviewed most recent clinic notes for changes in plan: Yes    Most recent labs reviewed: Yes    Appointment Information:   Date of last appointment:  12/31/2021    Follow up appointment scheduled : Yes 01/11/2022      Medication Information:     Medication name: Pegasys    Medication in [place] Plan: Yes     Date of last refill released from [facility]: 09/29/21    Weight change >10%: Not applicable    Has a dose adjustment been made since last refill?No    Is Pharmacy listed in [place] treatment plan? Yes    Pharmacy: [facility]    Consent Date 11/17/20- will renew next office visit    Patient Contact  Method of communication: MyChart  Confirmed need for refill: Yes  Patient taking medication as directed Yes  Confirmation of receiving pharmacy: Yes  Patient is anticipating a start date of: continuation of therapy    Other Information:     Plan was routed to Dr. Molli Knock

## 2022-01-11 ENCOUNTER — Encounter: Admit: 2022-01-11 | Discharge: 2022-01-11 | Payer: Private Health Insurance - Indemnity

## 2022-01-11 DIAGNOSIS — D45 Polycythemia vera: Secondary | ICD-10-CM

## 2022-01-11 LAB — COMPREHENSIVE METABOLIC PANEL
ALBUMIN: 4.4 g/dL (ref 3.5–5.0)
ALK PHOSPHATASE: 45 U/L — ABNORMAL LOW (ref 25–110)
ALT: 11 U/L (ref 7–56)
ANION GAP: 5 K/UL (ref 3–12)
AST: 13 U/L (ref 7–40)
BLD UREA NITROGEN: 14 mg/dL (ref 7–25)
CALCIUM: 9.1 mg/dL (ref 8.5–10.6)
CO2: 28 MMOL/L (ref 21–30)
CREATININE: 0.9 mg/dL (ref 0.4–1.00)
EGFR: >60 mL/min (ref >60)
GLUCOSE,PANEL: 94 mg/dL (ref 70–100)
SODIUM: 136 MMOL/L — ABNORMAL LOW (ref 137–147)
TOTAL BILIRUBIN: 0.4 mg/dL (ref 0.3–1.2)
TOTAL PROTEIN: 7.5 g/dL — ABNORMAL HIGH (ref 6.0–8.0)

## 2022-01-11 LAB — CBC AND DIFF
ABSOLUTE BASO COUNT: 0 K/UL (ref 0–0.20)
ABSOLUTE EOS COUNT: 0.2 K/UL (ref 0–0.45)
ABSOLUTE MONO COUNT: 0.4 K/UL (ref 0–0.80)
WBC COUNT: 5.3 K/UL (ref 4.5–11.0)

## 2022-01-11 LAB — THYROID STIMULATING HORMONE-TSH: TSH: 2.2 uU/mL (ref 0.35–5.00)

## 2022-01-11 LAB — LDH-LACTATE DEHYDROGENASE: LDH: 159 U/L (ref 100–210)

## 2022-01-11 MED ORDER — HYDROCHLOROTHIAZIDE 12.5 MG PO CAP
12.5 mg | ORAL_CAPSULE | Freq: Every morning | ORAL | 1 refills | 30.00000 days | Status: AC
Start: 2022-01-11 — End: ?

## 2022-01-11 NOTE — Progress Notes
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 Kelsey Kelsey [name] reports that she has been doing well since Kelsey recent dose increase. However, since then, she reports that she has had fatigue on Wed and Thursday after Kelsey weekly shot. She is still able to do all her ADLs without any issues. She notes some headaches and redness in her eyes over this last weekend which is still persistent. Kelsey Kelsey [name] otherwise feels well enough to start Kelsey higher dose of her interferon today.     Interval History:  Kelsey Kelsey [name] presents today for follow-up. She is here with her husband. She overall feels well.   She has been taking Pegasys 180 mcg weekly since last visit  She tolerated dose escalation but had flulike symptoms particularly headache.  This has resolved.  She required 1 phlebotomy but her hematocrit was below 45%.  Otherwise she has been phlebotomy free since August 2022.  Her platelet count is improving.  She was at her primary care physician, she is battling with uncontrolled hypertension.  She also had a new nonmalignant breast mass.          History of Present Illness        Family Hx: Father-prostate cancer    MGM-ovarian cancer    MGF-brain tumor  Great aunt-breast cancer    Medical Hx: Depression/anxiety, COVID infection September 2021   Surgical Hx: Wisdom tooth extraction    Social Hx:  Married  Current smoker   Alcohol use    HPI:  Kelsey [name] was seen for consult by hematology 04/23/2020 due to an elevated platelet count that first became evident in early November.  Kelsey [name] had no previous history of thrombocytosis.   Kelsey [name]'s work up included additional labs and a BMBX.  She is a direct referral to Dr Molli Knock to discuss treatment options.     ?  Records are in [facility] including outside records that have been scanned into Kelsey chart.  Additional records are in Care Everywhere.     Timeline of Events:   DATES  ? ? ?   04/15/2020 Labs CBC:  WBC 11.7, Hg 15.5, Hct 48.3,  platelets 817   04/17/2020 Labs  Peripheral blood:  Erythrocytosis and thrombocytosis    04/23/2020 Labs CBC:  WBc 10.2, Hg 14.8, Hct 47.4, platelets 888  Folate:  16.65  B12:  564  Iron studies:  Iron 62.00, TIBC 335.0, % Sat 18.51, Ferritin  32.0  LDH:  210  Erythropoietin:  2.4   05/27/2020 US-Abdomen complete  Impression:  Minimal hepatosplenomegaly just above normal limites.  Normal echogenicity of Kelsey liver and spleen.     06/11/2020 BMBX BM:  Normocellular bone with megakaryocytic hyperplasia and mild reticulin fibrosis.    FLOW:  No diagnostic immunophenotypic abnormalities detected.  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  Review of Systems   Constitutional: Positive for fatigue. Negative for activity change, appetite change, chills, fever and unexpected weight change.   HENT: Negative for congestion, mouth sores,  sore throat, tinnitus and trouble swallowing.    Eyes: Positive for visual disturbance.   Respiratory: Negative for cough, chest tightness, shortness of breath and wheezing.    Cardiovascular: Negative for chest pain, palpitations and leg swelling.   Gastrointestinal: Negative for abdominal distention, abdominal pain, blood in stool, constipation, diarrhea, nausea and vomiting.   Genitourinary: Negative for difficulty urinating, dysuria, frequency and urgency.   Musculoskeletal: Negative for arthralgias, back pain and joint swelling.   Skin: Negative for rash.   Neurological: Positive for headaches. Negative for dizziness, weakness and numbness.   Hematological: Negative for adenopathy. Does not bruise/bleed easily.   Psychiatric/Behavioral: Negative for sleep disturbance. Kelsey Kelsey [name] is not nervous/anxious. ?    Objective:         ? aspirin EC 81 mg tablet Take one tablet by mouth daily. Take with food.   ? buPROPion XL (WELLBUTRIN XL) 150 mg tablet    ? dilTIAZem CD (CARDIZEM CD) 120 mg capsule Take 180 mg by mouth daily.   ? hydroCHLOROthiazide 12.5 mg capsule Take one capsule by mouth every morning.   ? peginterferon alfa-2a (PEGASYS) 180 mcg/0.5 mL injection syringe Inject 0.375 mL under Kelsey skin every 7 days.     Vitals:    01/11/22 1213   BP: (!) 132/102   BP Source: Arm, Left Upper   Pulse: 81   Temp: 36.7 ?C (98 ?F)   Resp: 18   SpO2: 99%   TempSrc: Temporal   PainSc: Zero   Weight: 92 kg (202 lb 12.8 oz)     Body mass index is 32.83 kg/m?Marland Kitchen     Physical Exam  Constitutional:       General: She is not in acute distress.     Appearance: Normal appearance. She is normal weight.   HENT:      Head: Normocephalic and atraumatic.      Nose: Nose normal.      Mouth/Throat:      Mouth: Mucous membranes are moist.      Pharynx: Oropharynx is clear.   Eyes:      General: No scleral icterus.        Right eye: No discharge.         Left eye: No discharge.      Extraocular Movements: Extraocular movements intact.      Conjunctiva/sclera: Conjunctivae normal.   Pulmonary:      Effort: Pulmonary effort is normal. No respiratory distress.      Breath sounds: Normal breath sounds. No stridor. No wheezing.   Musculoskeletal:         General: Normal range of motion.      Cervical back: Normal range of motion.   Skin:     Coloration: Skin is not jaundiced or pale.      Findings: No bruising, erythema, lesion or rash.   Neurological:      General: No focal deficit present.      Mental Status: She is alert and oriented to person, place, and time. Mental status is at baseline.      Cranial Nerves: No cranial nerve deficit.      Motor: No weakness.      Gait: Gait normal.   Psychiatric:         Mood and Affect: Mood normal.         Behavior: Behavior normal.         Thought Content: Thought content normal.         Judgment: Judgment  normal.     :    RN in room: GR    Assessment and Plan:  ?  ?  Polycythemia vera (HCC)  Kelsey Kelsey [name] is a 36 y.o. female who presents for follow up for PV.       In regard to diagnosis:  Kelsey [name] presents with significant erythrocytosis and panmyelosis.  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  Serum EPO level suppressed.  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  Kelsey bone marrow biopsy is consistent with a diagnosis of MPN with decreased iron stores on Kelsey bone marrow biopsy core.  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  Thrombocytosis and leukocytosis can also be observed in PV.  ?  Kelsey Kelsey [name] has high risk bone marrow features concerning for possible evidence of early myelofibrosis.  This is not associated with increased LDH, leukoerythroblastosis, or other high risk features at this time.  Repeat bone marrow biopsy at [facility] as possible dysplastic features, this is not clinically consistent with Kelsey Kelsey [name]'s presentation with persistent panmyelosis and requirement for phlebotomy.  Her cytogenetics and molecular profile is not of high concern.  Future bone marrow biopsy will be required to further delineate disease evolution.  ?  In regard to prognosis  I counseled Kelsey Kelsey [name] and her husband regarding Kelsey prognosis polycythemia vera which is associated with normal life expectancy to compared to peers,in general, however will require therapy to reduce disease related symptoms and disease related complications..  I also warned Kelsey Kelsey [name] about Kelsey risk of transformation to high risk disease in future.  We will continue to watch for evolution by serial bone marrow biopsies  ?  In regard to goals of care  I emphasized Kelsey importance of symptom control as well as a hematocrit control to achieve a hematocrit goal of under 45% consistently.  Also discussed with Kelsey Kelsey [name] Kelsey goal of reducing thrombotic risk with aspirin.  Kelsey [name] has researched interferon use and is interested in interferon therapy early on for as a disease modifying intervention.  ?  Kelsey Kelsey [name] has achieved cytoreduction with phlebotomy however she continues to have significant symptom burden and poor quality of life.  She is interested in pharmacotherapy.  We discussed at length Kelsey use of hydroxyurea and interferon, mechanism of action, pros and cons, financial aspects and logistic aspects.  I have also discussed Kelsey collaboration with her primary oncologist locally given her long commute.  After discussion she opted to proceed with pegylated interferon therapy.  She signed informed consent for Kelsey interferon.  She met with Kelsey pharmacist for further education.  We started Kelsey Kelsey [name] on interferon in August after she returned from her vacation.  ?  Treatment  Pegasys 45 mcg subcutaneously weekly Started 01/26/21   Pegasys 90 mcg subcutaneously weekly Started 03/16/21    Pegasys 135 mcg subcutaneously weekly Started 04/27/2021  Pegasys 180 mcg subcutaneously weekly Started April 2023  ?  Educated Kelsey [name] and her husband regarding symptoms and signs of depression anxiety. Kelsey Kelsey [name] has had successful therapy without need for therapeutic phlebotomy for several months.     She is manifesting successful therapy with nearly phlebotomy free start state and now reduction in Kelsey platelet count.    Will continue basis at Kelsey same dose and schedule  To continue collaboration with her local oncologist and primary care physician.    Also discussed with Kelsey Kelsey [name] we will start Kelsey Kelsey [name] on hydrochlorothiazide 12.5 mg daily  in regard to fertility:  Kelsey Kelsey [name] is not interested in future fertility, husband had permanent surgical contraception  ?  In regard to health maintenance, survivorship  and primary prevention  ? Kelsey Kelsey [name] had abnormal breast mass for which she underwent imaging that is consistent with benign findings.  Exam her primary care and gynecology.  This is how she presented to hematology oncology and this led to diagnosis of her MPN  ? Recommended skin exam by dermatologist.  ? For Kelsey primary care prevention for cardiovascular risk reduction.  ? Is not clear if changes in renal function or hypertension is related to therapy.  This is being managed by her local physicians.  ? For uncontrolled hypertension, and after discussion with Kelsey  Kelsey [name], we will start Kelsey Kelsey [name] on hydrochlorothiazide 12.5 mg daily, further follow-up with her primary care physician.  ?        Jonni Sanger, MD,         Total of 30 minutes were spent on Kelsey same day of Kelsey visit including preparing to see Kelsey Kelsey [name], obtaining  and/or reviewing separately obtained history, performing a medically appropriate examination and/or  evaluation, counseling and educating Kelsey Kelsey [name]/family/caregiver, ordering medications, tests, or  procedures, referring and communication with other health care professionals, documenting clinical  information in Kelsey electronic or other health record, independently interpreting results and communicating  results to Kelsey Kelsey [name]/family/caregiver, and care coordination.

## 2022-01-11 NOTE — Patient Instructions
Your care team:     Dr. Abdulraheem Yacoub   Hematologist   Ola Kilanko, APRN   Nurse Practitioner   Clinical Nurse Coordinators (CNC's)   Laura Vaughan RN, BSN, Brilyn Tuller Rohrer RN, BSN, Meg Wolters RN      Phone numbers:     Scheduling # [phone]    CNCs Laura, Tameka Hoiland & Meg  # [phone]  Messages left on nurses' line are checked Monday-Friday 8:00 AM - 4:00 PM.    Evening (after 4:00 PM), weekend and holiday on-call # [phone]   For urgent needs after hours, please ask for the oncologist on-call to be paged.   For urgent needs during business hours, please ask for Laura, Lariza Cothron & Meg Dr. Yacoub's nurses, to be paged.    Notes:   - Allow one business week for our office to complete any requested paperwork (FMLA, etc.)   - Allow two to three business days for all medication refills. Please call your pharmacy first to check for available refills.

## 2022-01-11 NOTE — Assessment & Plan Note
Kelsey [name] is a 36 y.o. female who presents for follow up for PV.       In regard to diagnosis:  Patient presents with significant erythrocytosis and panmyelosis.  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  Serum EPO level suppressed.  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  Thrombocytosis and leukocytosis can also be observed in PV.  ?  The patient has high risk bone marrow features concerning for possible evidence of early myelofibrosis.  This is not associated with increased LDH, leukoerythroblastosis, or other high risk features at this time.  Repeat bone marrow biopsy at [facility] as possible dysplastic features, this is not clinically consistent with the patient's presentation with persistent panmyelosis and requirement for phlebotomy.  Her cytogenetics and molecular profile is not of high concern.  Future bone marrow biopsy will be required to further delineate disease evolution.  ?  In regard to prognosis  I counseled the patient and her husband regarding the prognosis polycythemia vera which is associated with normal life expectancy to compared to peers,in general, however will require therapy to reduce disease related symptoms and disease related complications..  I also warned the patient about the risk of transformation to high risk disease in future.  We will continue to watch for evolution by serial bone marrow biopsies  ?  In regard to goals of care  I emphasized the importance of symptom control as well as a hematocrit control to achieve a hematocrit goal of under 45% consistently.  Also discussed with the patient the goal of reducing thrombotic risk with aspirin.  Patient has researched interferon use and is interested in interferon therapy early on for as a disease modifying intervention.  ?  The patient has achieved cytoreduction with phlebotomy however she continues to have significant symptom burden and poor quality of life.  She is interested in pharmacotherapy.  We discussed at length the use of hydroxyurea and interferon, mechanism of action, pros and cons, financial aspects and logistic aspects.  I have also discussed the collaboration with her primary oncologist locally given her long commute.  After discussion she opted to proceed with pegylated interferon therapy.  She signed informed consent for the interferon.  She met with the pharmacist for further education.  We started the patient on interferon in August after she returned from her vacation.  ?  Treatment  Pegasys 45 mcg subcutaneously weekly Started 01/26/21   Pegasys 90 mcg subcutaneously weekly Started 03/16/21    Pegasys 135 mcg subcutaneously weekly Started 04/27/2021  Pegasys 180 mcg subcutaneously weekly Started April 2023  ?  Educated patient and her husband regarding symptoms and signs of depression anxiety. The patient has had successful therapy without need for therapeutic phlebotomy for several months.     She is manifesting successful therapy with nearly phlebotomy free start state and now reduction in the platelet count.    Will continue basis at the same dose and schedule  To continue collaboration with her local oncologist and primary care physician.    Also discussed with the patient we will start the patient on hydrochlorothiazide 12.5 mg daily in regard to fertility:  The patient is not interested in future fertility, husband had permanent surgical contraception  ?  In regard to health maintenance, survivorship  and primary prevention  ? The patient had abnormal breast mass for which she underwent imaging that is consistent with benign findings.  Exam her primary care and gynecology.  This is how she presented to hematology oncology and this led to diagnosis of her MPN  ? Recommended skin exam by dermatologist.  ? For the primary care prevention for cardiovascular risk reduction.  ? Is not clear if changes in renal function or hypertension is related to therapy.  This is being managed by her local physicians.  ? For uncontrolled hypertension, and after discussion with the  patient, we will start the patient on hydrochlorothiazide 12.5 mg daily, further follow-up with her primary care physician.  ?

## 2022-01-27 ENCOUNTER — Encounter: Admit: 2022-01-27 | Discharge: 2022-01-27 | Payer: Private Health Insurance - Indemnity

## 2022-04-09 ENCOUNTER — Encounter: Admit: 2022-04-09 | Discharge: 2022-04-09 | Payer: Private Health Insurance - Indemnity

## 2022-04-12 ENCOUNTER — Encounter: Admit: 2022-04-12 | Discharge: 2022-04-12 | Payer: Private Health Insurance - Indemnity

## 2022-04-12 DIAGNOSIS — D45 Polycythemia vera: Secondary | ICD-10-CM

## 2022-04-12 LAB — COMPREHENSIVE METABOLIC PANEL
ALBUMIN: 4.4 g/dL (ref 3.5–5.0)
ALK PHOSPHATASE: 47 U/L — ABNORMAL LOW (ref 25–110)
ALT: 12 U/L (ref 7–56)
ANION GAP: 5 K/UL (ref 3–12)
AST: 12 U/L (ref 7–40)
BLD UREA NITROGEN: 12 mg/dL (ref 7–25)
CALCIUM: 9 mg/dL (ref 8.5–10.6)
CO2: 28 MMOL/L (ref 21–30)
CREATININE: 0.9 mg/dL (ref 0.4–1.00)
EGFR: >60 mL/min (ref >60)
GLUCOSE,PANEL: 128 mg/dL — ABNORMAL HIGH (ref 70–100)
SODIUM: 137 MMOL/L (ref 137–147)
TOTAL BILIRUBIN: 0.4 mg/dL (ref 0.3–1.2)
TOTAL PROTEIN: 6.9 g/dL — ABNORMAL HIGH (ref 6.0–8.0)

## 2022-04-12 LAB — LDH-LACTATE DEHYDROGENASE: LDH: 137 U/L (ref 100–210)

## 2022-04-12 LAB — CBC AND DIFF
ABSOLUTE BASO COUNT: 0 K/UL (ref 0–0.20)
ABSOLUTE EOS COUNT: 0.1 K/UL (ref 0–0.45)
ABSOLUTE MONO COUNT: 0.4 K/UL (ref 0–0.80)
WBC COUNT: 5.3 K/UL (ref 4.5–11.0)

## 2022-04-12 LAB — THYROID STIMULATING HORMONE-TSH: TSH: 1.1 uU/mL (ref 0.35–5.00)

## 2022-04-12 MED ORDER — PEGASYS 180 MCG/0.5 ML SC SYRG
180 ug | SUBCUTANEOUS | 3 refills | Status: AC
Start: 2022-04-12 — End: ?

## 2022-04-12 NOTE — Patient Instructions
Your care team:     Dr. Abdulraheem Yacoub   Hematologist   Ola Kilanko, APRN   Nurse Practitioner   Clinical Nurse Coordinators (CNC's)   Laura Vaughan RN, BSN, Carolan Avedisian Rohrer RN, BSN, Meg Wolters RN      Phone numbers:     Scheduling # [phone]    CNCs Laura, Romelle Reiley & Meg  # [phone]  Messages left on nurses' line are checked Monday-Friday 8:00 AM - 4:00 PM.    Evening (after 4:00 PM), weekend and holiday on-call # [phone]   For urgent needs after hours, please ask for the oncologist on-call to be paged.   For urgent needs during business hours, please ask for Laura, Rayven Rettig & Meg Dr. Yacoub's nurses, to be paged.    Notes:   - Allow one business week for our office to complete any requested paperwork (FMLA, etc.)   - Allow two to three business days for all medication refills. Please call your pharmacy first to check for available refills.

## 2022-04-12 NOTE — Progress Notes
Date of Service: 04/12/2022    Name: Kelsey [name]  DOB: 1986/05/07  MRN: [id number]     Subjective:            Kelsey [name] is a 36 y.o. 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 The patient reports that she has been doing well since the recent dose increase. However, since then, she reports that she has had fatigue on Wed and Thursday after the weekly shot. She is still able to do all her ADLs without any issues. She notes some headaches and redness in her eyes over this last weekend which is still persistent. The patient otherwise feels well enough to start the higher dose of her interferon today.     Interval History:  Kelsey [name] presents today for follow-up. She has been taking Pegasys 180 mcg weekly since last visit. She had a phlebotomy in October 2023 due to Hct 46.1, along with phlebotomy in August 2023 but Hct was below 45. She has had some memory issues/brain fog since taking Pegasys, and initially had headaches, now managed by prn Vyvanse.        History of Present Illness        Family Hx: Father-prostate cancer    MGM-ovarian cancer    MGF-brain tumor  Great aunt-breast cancer    Medical Hx: Depression/anxiety, COVID infection September 2021   Surgical Hx: Wisdom tooth extraction    Social Hx:  Married  Current smoker   Alcohol use    HPI:  Patient was seen for consult by hematology 04/23/2020 due to an elevated platelet count that first became evident in early November.  Patient had no previous history of thrombocytosis.   Patient's work up included additional labs and a BMBX.  She is a direct referral to Dr Molli Knock to discuss treatment options.     ???  Records are in [facility] including outside records that have been scanned into the chart.  Additional records are in Care Everywhere.     Timeline of Events:   DATES  ??? ??? ???   04/15/2020 Labs CBC:  WBC 11.7, Hg 15.5, Hct 48.3,  platelets 817   04/17/2020 Labs  Peripheral blood:  Erythrocytosis and thrombocytosis    04/23/2020 Labs CBC:  WBc 10.2, Hg 14.8, Hct 47.4, platelets 888  Folate:  16.65  B12:  564  Iron studies:  Iron 62.00, TIBC 335.0, % Sat 18.51, Ferritin  32.0  LDH:  210  Erythropoietin:  2.4   05/27/2020 US-Abdomen complete  Impression:  Minimal hepatosplenomegaly just above normal limites.  Normal echogenicity of the liver and spleen.     06/11/2020 BMBX BM:  Normocellular bone with megakaryocytic hyperplasia and mild reticulin fibrosis.    FLOW:  No diagnostic immunophenotypic abnormalities detected.  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  Review of Systems   Constitutional: Positive for fatigue. Negative for activity change, appetite change, chills, fever and unexpected weight change.   HENT: Negative for congestion, mouth sores, sore throat, tinnitus and trouble swallowing.    Eyes: Positive for visual disturbance.   Respiratory: Negative for cough, chest tightness, shortness of breath and wheezing.    Cardiovascular: Negative for chest pain, palpitations and leg  swelling.   Gastrointestinal: Negative for abdominal distention, abdominal pain, blood in stool, constipation, diarrhea, nausea and vomiting.   Genitourinary: Negative for difficulty urinating, dysuria, frequency and urgency.   Musculoskeletal: Negative for arthralgias, back pain and joint swelling.   Skin: Negative for rash.   Neurological: Positive for headaches (improved). Negative for dizziness, weakness and numbness.   Hematological: Negative for adenopathy. Does not bruise/bleed easily.   Psychiatric/Behavioral: Negative for sleep disturbance. The patient is not nervous/anxious.    ???    Objective:         ??? aspirin EC 81 mg tablet Take one tablet by mouth daily. Take with food.   ??? buPROPion XL (WELLBUTRIN XL) 150 mg tablet    ??? diltiazem CD (CARDIZEM CD) 240 mg capsule Take one capsule by mouth daily.   ??? lisdexamfetamine (VYVANSE) 30 mg capsule Take one capsule by mouth daily.   ??? naltrexone (DEPADE) 50 mg tablet Take one tablet by mouth daily.   ??? peginterferon alfa-2a (PEGASYS) 180 mcg/0.5 mL injection syringe Inject 0.375 mL under the skin every 7 days.     Vitals:    04/12/22 1302   BP: 128/82   BP Source: Arm, Left Upper   Pulse: 91   Temp: 37 ???C (98.6 ???F)   Resp: 16   SpO2: 98%   PainSc: Zero   Weight: 88.9 kg (196 lb)     Body mass index is 31.73 kg/m???.     Physical Exam  Constitutional:       General: She is not in acute distress.     Appearance: Normal appearance. She is normal weight.   HENT:      Head: Normocephalic and atraumatic.      Nose: Nose normal.      Mouth/Throat:      Mouth: Mucous membranes are moist.      Pharynx: Oropharynx is clear.   Eyes:      General: No scleral icterus.        Right eye: No discharge.         Left eye: No discharge.      Extraocular Movements: Extraocular movements intact.      Conjunctiva/sclera: Conjunctivae normal.   Pulmonary:      Effort: Pulmonary effort is normal. No respiratory distress.      Breath sounds: Normal breath sounds. No stridor. No wheezing.   Musculoskeletal:         General: Normal range of motion.      Cervical back: Normal range of motion.   Skin:     Coloration: Skin is not jaundiced or pale.      Findings: No bruising, erythema, lesion or rash.   Neurological:      General: No focal deficit present.      Mental Status: She is alert and oriented to person, place, and time. Mental status is at baseline.      Cranial Nerves: No cranial nerve deficit.      Motor: No weakness.      Gait: Gait normal.   Psychiatric:         Mood and Affect: Mood normal.         Behavior: Behavior normal.         Thought Content: Thought content normal.         Judgment: Judgment normal.         Assessment and Plan:  ???  ???  Polycythemia vera (HCC)  Kelsey [name] is a 36 y.o.  female who presents for follow up for PV.       In regard to diagnosis:  Patient presents with significant erythrocytosis and panmyelosis.  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  Serum EPO level suppressed.  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  Thrombocytosis and leukocytosis can also be observed in PV.  ???  The patient has high risk bone marrow features concerning for possible evidence of early myelofibrosis.  This is not associated with increased LDH, leukoerythroblastosis, or other high risk features at this time.  Repeat bone marrow biopsy at [facility] as possible dysplastic features, this is not clinically consistent with the patient's presentation with persistent panmyelosis and requirement for phlebotomy.  Her cytogenetics and molecular profile is not of high concern.  Future bone marrow biopsy will be required to further delineate disease evolution.  ???  In regard to prognosis  I counseled the patient and her husband regarding the prognosis polycythemia vera which is associated with normal life expectancy to compared to peers,in general, however will require therapy to reduce disease related symptoms and disease related complications..  I also warned the patient about the risk of transformation to high risk disease in future.  We will continue to watch for evolution by serial bone marrow biopsies  ???  In regard to goals of care  I emphasized the importance of symptom control as well as a hematocrit control to achieve a hematocrit goal of under 45% consistently.  Also discussed with the patient the goal of reducing thrombotic risk with aspirin.  Patient has researched interferon use and is interested in interferon therapy early on for as a disease modifying intervention.  ???  The patient has achieved cytoreduction with phlebotomy however she continues to have significant symptom burden and poor quality of life.  She is interested in pharmacotherapy.  We discussed at length the use of hydroxyurea and interferon, mechanism of action, pros and cons, financial aspects and logistic aspects.  I have also discussed the collaboration with her primary oncologist locally given her long commute.  After discussion she opted to proceed with pegylated interferon therapy.  She signed informed consent for the interferon.  She met with the pharmacist for further education.  We started the patient on interferon in August after she returned from her vacation.  ???  Treatment  Pegasys 45 mcg subcutaneously weekly Started 01/26/21   Pegasys 90 mcg subcutaneously weekly Started 03/16/21    Pegasys 135 mcg subcutaneously weekly Started 04/27/2021  Pegasys 180 mcg subcutaneously weekly Started April 2023  ???  Educated patient and her husband regarding symptoms and signs of depression anxiety. The patient has had successful therapy, with reduced need for phlebotomy (last October 2023, twice total in 2023).     Will continue basis at the same dose and schedule  To continue collaboration with her local oncologist and primary care physician.    In regard to fertility:  The patient is not interested in future fertility, husband had permanent surgical contraception  ???  In regard to health maintenance, survivorship  and primary prevention  ??? The patient had abnormal breast mass for which she underwent imaging that is consistent with benign findings.  Exam her primary care and gynecology.  This is how she presented to hematology oncology  and this led to diagnosis of her MPN  ??? Recommended skin exam by dermatologist.  ??? For the primary care prevention for cardiovascular risk reduction.  ??? Is not clear if changes in renal function or hypertension is related to therapy.  This is being managed by her local physicians.    RTC in 6 months with peripheral JAK2 testing. Patient seen and discussed with Dr. Molli Knock.    Lajoyce Lauber, M.D.  Hematology/Oncology Fellow, PGY-6  Available on [employer]    ???      Lajoyce Lauber, MD,           ATTESTATION    I personally performed the key portions of the E/M visit, discussed case with resident and concur with resident documentation of history, physical exam, assessment, and treatment plan unless otherwise noted.     Total of 30 minutes were spent on the same day of the visit including preparing to see the patient, obtaining  and/or reviewing separately obtained history, performing a medically appropriate examination and/or  evaluation, counseling and educating the patient/family/caregiver, ordering medications, tests, or  procedures, referring and communication with other health care professionals, documenting clinical  information in the electronic or other health record, independently interpreting results and communicating  results to the patient/family/caregiver, and care coordination.    Staff name:  Jonni Sanger, MD Date: 04/12/2022

## 2022-04-12 NOTE — Progress Notes
Oral Chemotherapy Pharmacist Reassessment Note    Summary of Therapy  Kelsey [name] continues peginterferon for the treatment of polycythemia vera.  Cycle 1 start date was 01/26/21. Kelsey [name] confirms she is taking the medication as prescribed - 180 SQ mcg weekly.     Adverse Effects and Adherence Assessment   Kelsey [name] is not experiencing any significant adverse effects to this medication regimen.     The patient's ability to self-administer medication was re-assessed. The patient has the ability to self-administer this medication.  Kelsey [name] reports missing 0 doses in the past 12 month(s) of therapy. she was re-educated on importance of adherence.    Dose Appropriateness Assessment  No renal or hepatic dose adjustment is required at this time and treatment will continue until progression or unacceptable toxicity.        CBC w diff    Lab Results   Component Value Date/Time    WBC 5.3 04/12/2022 12:45 PM    RBC 4.94 04/12/2022 12:45 PM    HGB 13.1 04/12/2022 12:45 PM    HCT 39.4 04/12/2022 12:45 PM    MCV 79.7 (L) 04/12/2022 12:45 PM    MCH 26.5 04/12/2022 12:45 PM    MCHC 33.2 04/12/2022 12:45 PM    RDW 14.6 04/12/2022 12:45 PM    PLTCT 663 (H) 04/12/2022 12:45 PM    MPV 7.9 04/12/2022 12:45 PM    Lab Results   Component Value Date/Time    NEUT 69 04/12/2022 12:45 PM    ANC 3.70 04/12/2022 12:45 PM    LYMA 21 (L) 04/12/2022 12:45 PM    ALC 1.10 04/12/2022 12:45 PM    MONA 7 04/12/2022 12:45 PM    AMC 0.40 04/12/2022 12:45 PM    EOSA 3 04/12/2022 12:45 PM    AEC 0.10 04/12/2022 12:45 PM    BASA 0 04/12/2022 12:45 PM    ABC 0.00 04/12/2022 12:45 PM        Comprehensive Metabolic Profile    Lab Results   Component Value Date/Time    NA 137 04/12/2022 12:45 PM    K 4.1 04/12/2022 12:45 PM    CL 104 04/12/2022 12:45 PM    CO2 28 04/12/2022 12:45 PM    GAP 5 04/12/2022 12:45 PM    BUN 12 04/12/2022 12:45 PM    CR 0.90 04/12/2022 12:45 PM    GLU 128 (H) 04/12/2022 12:45 PM    Lab Results   Component Value Date/Time CA 9.0 04/12/2022 12:45 PM    ALBUMIN 4.4 04/12/2022 12:45 PM    TOTPROT 6.9 04/12/2022 12:45 PM    ALKPHOS 47 04/12/2022 12:45 PM    AST 12 04/12/2022 12:45 PM    ALT 12 04/12/2022 12:45 PM    TOTBILI 0.4 04/12/2022 12:45 PM            Serum creatinine: 0.9 mg/dL 16/10/96 0454  Estimated creatinine clearance: 98.7 mL/min    Medication Reconciliation and Drug/Food Interaction Assessment  A comprehensive medication reconciliation was performed for Kelsey [name]. her medication list was updated to reflect any identified changes and the patient???s current medication list is included below. Kelsey [name] was instructed to speak with her healthcare provider and/or the oral chemotherapy pharmacist before starting any new drug, including, but not limited to, prescription, over the counter, natural / herbal products, or vitamins and supplements.     Drug-drug and drug-food interactions were assessed between the patients??? specialty medication(s) and her most current medication list. No significant  drug-drug interactions were identified.     Home Medications    Medication Sig   aspirin EC 81 mg tablet Take one tablet by mouth daily. Take with food.   buPROPion XL (WELLBUTRIN XL) 150 mg tablet    diltiazem CD (CARDIZEM CD) 240 mg capsule Take one capsule by mouth daily.   lisdexamfetamine (VYVANSE) 30 mg capsule Take one capsule by mouth daily.   naltrexone (DEPADE) 50 mg tablet Take one tablet by mouth daily.   peginterferon alfa-2a (PEGASYS) 180 mcg/0.5 mL injection syringe Inject 0.375 mL under the skin every 7 days.       No Known Allergies    Response to Therapy Assessment  The electronic medical record for Kelsey [name] has been reviewed. The patient???s provider has determined this therapy is appropriate to continue at this time. The patient will continue this therapy as she is achieving therapeutic benefit.     Immunization Assessment     There is no immunization history on file for this patient.    Vaccine history and recommended vaccinations were reviewed and will be recommended as appropriate. The patient will be reminded about the importance of receiving an annual influenza vaccine as indicated.      Safety Assessment  Risk Evaluation and Mitigation Strategy (REMS)  No REMS is required for this medication.    Reproductive Risk  Kelsey [name] is a 36 y.o. female. As patient is a female of child-bearing potential, she was instructed regarding effective contraception during and after treatment, and that in the event that she becomes pregnant she should contact her physician immediately.     Handling and Disposal  Appropriate safe handling and disposal procedures were reviewed with the patient. Kelsey [name] was instructed to return any unused or expired oral chemotherapy medication to a designated disposal bin at one of the [facility] locations or to utilize a community drug take back program. Instructed not to flush down the toilet or to crush the medication    Follow-up Plan   Kelsey [name] was encouraged to call the oral chemotherapy pharmacist at [phone] with questions. This medication is considered medium risk per our internal oral chemotherapy risk categorization. This re-assessment has been completed and the next reassessment planned for  months.    Adele Schilder  Oncology Clinical Pharmacist  04/12/2022

## 2022-08-31 ENCOUNTER — Encounter: Admit: 2022-08-31 | Discharge: 2022-08-31 | Payer: Private Health Insurance - Indemnity

## 2022-08-31 DIAGNOSIS — D45 Polycythemia vera: Secondary | ICD-10-CM

## 2022-08-31 MED ORDER — PEGASYS 180 MCG/0.5 ML SC SYRG
3 refills
Start: 2022-08-31 — End: ?

## 2022-09-01 ENCOUNTER — Encounter: Admit: 2022-09-01 | Discharge: 2022-09-01 | Payer: Private Health Insurance - Indemnity

## 2022-09-07 ENCOUNTER — Encounter: Admit: 2022-09-07 | Discharge: 2022-09-07 | Payer: Private Health Insurance - Indemnity

## 2022-09-07 DIAGNOSIS — D45 Polycythemia vera: Secondary | ICD-10-CM

## 2022-09-07 MED ORDER — PEGASYS 180 MCG/0.5 ML SC SYRG
180 ug | SUBCUTANEOUS | 3 refills | Status: AC
Start: 2022-09-07 — End: ?

## 2022-09-07 NOTE — Progress Notes
Name:Dwayne Ayvree Ricker           MRN: [id number]                 DOB:02/18/1986          Age: 37 y.o.  Date of Service: 09/07/22      Oral Chemotherapy Refill Review     Patient Information:     Correct patient/DOB/Diagnosis: Yes    Reviewed most recent clinic notes for changes in plan: Yes    Most recent labs reviewed: Yes    Appointment Information:   Date of last appointment:  04/12/2022    Follow up appointment scheduled : Yes 10/11/2022      Medication Information:     Medication name: Pegasys     Medication in [place] Plan: Yes     Date of last refill released from [facility]: 04/12/2022    Weight change >10%: No    Has a dose adjustment been made since last refill?No    Is Pharmacy listed in [place] treatment plan? Yes    [facility]     Consent Date 01/11/2022    Patient Contact  Method of communication: MyChart  Confirmed need for refill: Yes  Patient taking medication as directed Yes  Confirmation of receiving pharmacy: Yes  Patient is anticipating a start date of: Continuation of therapy     Other Information:     Plan was routed to Elige Ko, APRN

## 2022-10-06 ENCOUNTER — Encounter: Admit: 2022-10-06 | Discharge: 2022-10-06 | Payer: Private Health Insurance - Indemnity

## 2022-10-10 ENCOUNTER — Encounter: Admit: 2022-10-10 | Discharge: 2022-10-10 | Payer: Private Health Insurance - Indemnity

## 2022-10-10 DIAGNOSIS — D45 Polycythemia vera: Secondary | ICD-10-CM

## 2022-10-11 ENCOUNTER — Encounter: Admit: 2022-10-11 | Discharge: 2022-10-11 | Payer: Private Health Insurance - Indemnity

## 2022-10-11 DIAGNOSIS — D45 Polycythemia vera: Secondary | ICD-10-CM

## 2022-10-11 LAB — COMPREHENSIVE METABOLIC PANEL
ALT: 14 U/L (ref 7–56)
ANION GAP: 4 K/UL (ref 3–12)
BLD UREA NITROGEN: 17 mg/dL — ABNORMAL LOW (ref 7–25)
CALCIUM: 9.4 mg/dL — ABNORMAL HIGH (ref 8.5–10.6)
CREATININE: 0.9 mg/dL (ref 0.4–1.00)
EGFR: >60 mL/min (ref >60)
GLUCOSE,PANEL: 86 mg/dL — ABNORMAL LOW (ref 70–100)
SODIUM: 136 MMOL/L — ABNORMAL LOW (ref 137–147)
TOTAL PROTEIN: 7.6 g/dL — ABNORMAL HIGH (ref 6.0–8.0)

## 2022-10-11 LAB — IRON + BINDING CAPACITY + %SAT+ FERRITIN
% SATURATION: 8 % — ABNORMAL LOW (ref 28–42)
FERRITIN: 6 ng/mL — ABNORMAL LOW (ref 10–200)
IRON BINDING: 448 ug/dL — ABNORMAL HIGH (ref 270–380)
IRON: 38 ug/dL — ABNORMAL LOW (ref 50–160)

## 2022-10-11 LAB — CBC AND DIFF
ABSOLUTE BASO COUNT: 0.1 K/UL (ref 0–0.20)
ABSOLUTE EOS COUNT: 0 K/UL (ref 0–0.45)
ABSOLUTE MONO COUNT: 0.4 K/UL (ref 0–0.80)
WBC COUNT: 4.1 K/UL — ABNORMAL LOW (ref >60)

## 2022-10-11 LAB — THYROID STIMULATING HORMONE-TSH: TSH: 1.3 uU/mL (ref 0.35–5.00)

## 2022-10-11 LAB — URIC ACID: URIC ACID: 5 mg/dL (ref 2.0–7.0)

## 2022-10-11 LAB — LDH-LACTATE DEHYDROGENASE: LDH: 127 U/L (ref 100–210)

## 2022-10-11 NOTE — Assessment & Plan Note
Kelsey [name] is a 37 y.o. female who presents for follow up for PV.       In regard to diagnosis:  Patient presents with significant erythrocytosis and panmyelosis.  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  Serum EPO level suppressed.  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  Thrombocytosis and leukocytosis can also be observed in PV.     The patient has high risk bone marrow features concerning for possible evidence of early myelofibrosis.  This is not associated with increased LDH, leukoerythroblastosis, or other high risk features at this time.  Repeat bone marrow biopsy at [facility] as possible dysplastic features, this is not clinically consistent with the patient's presentation with persistent panmyelosis and requirement for phlebotomy.  Her cytogenetics and molecular profile is not of high concern.  Future bone marrow biopsy will be required to further delineate disease evolution.     In regard to prognosis  I counseled the patient and her husband regarding the prognosis polycythemia vera which is associated with normal life expectancy to compared to peers,in general, however will require therapy to reduce disease related symptoms and disease related complications..  I also warned the patient about the risk of transformation to high risk disease in future.  We will continue to watch for evolution by serial bone marrow biopsies     In regard to goals of care  I emphasized the importance of symptom control as well as a hematocrit control to achieve a hematocrit goal of under 45% consistently.  Also discussed with the patient the goal of reducing thrombotic risk with aspirin.  Patient has researched interferon use and is interested in interferon therapy early on for as a disease modifying intervention.     The patient has achieved cytoreduction with phlebotomy however she continues to have significant symptom burden and poor quality of life.  She is interested in pharmacotherapy.  We discussed at length the use of hydroxyurea and interferon, mechanism of action, pros and cons, financial aspects and logistic aspects.  I have also discussed the collaboration with her primary oncologist locally given her long commute.  After discussion she opted to proceed with pegylated interferon therapy.  She signed informed consent for the interferon.  She met with the pharmacist for further education.  We started the patient on interferon in August after she returned from her vacation.     Treatment  Pegasys 45 mcg subcutaneously weekly Started 01/26/21   Pegasys 90 mcg subcutaneously weekly Started 03/16/21    Pegasys 135 mcg subcutaneously weekly Started 04/27/2021  Pegasys 180 mcg subcutaneously weekly Started April 2023     Educated patient and her husband regarding symptoms and signs of depression anxiety. The patient has had successful therapy, with reduced need for phlebotomy (last October 2023, twice total in 2023).     Will continue basis at the same dose and schedule  To continue collaboration with her local oncologist and primary care physician.      In regard to health maintenance, survivorship  and primary prevention  The patient had abnormal breast mass for which she underwent imaging that is consistent with benign findings.    Exam her primary care and gynecology.  Will seek attention and management for her menstrual symptoms.   Recommended skin exam  by dermatologist.  For the primary care prevention for cardiovascular risk reduction.  Is not clear if changes in renal function or hypertension is related to therapy.  This is being managed by her local physicians.    RTC in 6 months with peripheral JAK2 testing.

## 2022-10-11 NOTE — Patient Instructions
Your care team:     Dr. Abdulraheem Yacoub   Hematologist   Ola Kilanko, APRN   Nurse Practitioner   Clinical Nurse Coordinators (CNC's)   Laura Vaughan RN, BSN, Chiante Peden RN, BSN, Meg Wolters RN      Phone numbers:     Scheduling # [phone]    CNCs Laura, Glynnis Gavel & Meg  # [phone]  Messages left on nurses' line are checked Monday-Friday 8:00 AM - 4:00 PM.    Evening (after 4:00 PM), weekend and holiday on-call # [phone]   For urgent needs after hours, please ask for the oncologist on-call to be paged.   For urgent needs during business hours, please ask for Laura, Mckenzye Cutright & Meg Dr. Yacoub's nurses, to be paged.    Notes:   - Allow one business week for our office to complete any requested paperwork (FMLA, etc.)   - Allow two to three business days for all medication refills. Please call your pharmacy first to check for available refills.

## 2022-10-11 NOTE — Progress Notes
Date of Service: 10/11/2022    Name: Blanch Blok  DOB: 11-12-1985  MRN: [id number]     Subjective:            Rajvi Liuzzo is a 37 y.o. 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 The patient reports that she has been doing well since the last visit.  Feels much better today, has actively lost weight, nearly 30 pounds in the last 6 months with diet and exercise.  She has no side effects to interferon therapy.  She has no MPN symptoms.  Continues to require phlebotomy intermittently.      She reports significant pain and discomfort as well as heavy bleeding with her.  And has not sought OB/GYN evaluation yet.  Denies abnormal bleeding otherwise.         History of Present Illness        Family Hx: Father-prostate cancer    MGM-ovarian cancer    MGF-brain tumor  Great aunt-breast cancer    Medical Hx: Depression/anxiety, COVID infection September 2021   Surgical Hx: Wisdom tooth extraction    Social Hx:  Married  Current smoker   Alcohol use    HPI:  Patient was seen for consult by hematology 04/23/2020 due to an elevated platelet count that first became evident in early November.  Patient had no previous history of thrombocytosis.   Patient's work up included additional labs and a BMBX.  She is a direct referral to Dr Molli Knock to discuss treatment options.        Records are in [facility] including outside records that have been scanned into the chart.  Additional records are in Care Everywhere.     Timeline of Events:   DATES          04/15/2020 Labs CBC:  WBC 11.7, Hg 15.5, Hct 48.3,  platelets 817   04/17/2020 Labs  Peripheral blood:  Erythrocytosis and thrombocytosis    04/23/2020 Labs CBC:  WBc 10.2, Hg 14.8, Hct 47.4, platelets 888  Folate:  16.65  B12:  564  Iron studies:  Iron 62.00, TIBC 335.0, % Sat 18.51, Ferritin  32.0  LDH:  210  Erythropoietin:  2.4   05/27/2020 US-Abdomen complete  Impression:  Minimal hepatosplenomegaly just above normal limites.  Normal echogenicity of the liver and spleen.     06/11/2020 BMBX BM:  Normocellular bone with megakaryocytic hyperplasia and mild reticulin fibrosis.    FLOW:  No diagnostic immunophenotypic abnormalities detected.  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 Negative for activity change, appetite change, chills, fever and unexpected weight change.   HENT: Negative for congestion, mouth sores, sore throat, tinnitus and trouble swallowing.    Eyes: Positive for visual disturbance.   Respiratory: Negative for cough, chest tightness, shortness of breath and wheezing.    Cardiovascular: Negative for chest pain, palpitations and leg swelling.   Gastrointestinal: Negative for abdominal distention, abdominal pain, blood in stool, constipation, diarrhea, nausea and vomiting.   Genitourinary: Negative for difficulty urinating, dysuria, frequency and urgency.   Musculoskeletal: Negative for arthralgias, back pain and joint swelling.   Skin: Negative for rash.  Neurological: Positive for headaches (improved). Negative for dizziness, weakness and numbness.   Hematological: Negative for adenopathy. Does not bruise/bleed easily.   Psychiatric/Behavioral: Negative for sleep disturbance. The patient is not nervous/anxious.         Objective:          aspirin EC 81 mg tablet Take one tablet by mouth daily. Take with food.    buPROPion XL (WELLBUTRIN XL) 150 mg tablet     diltiazem CD (CARDIZEM CD) 240 mg capsule Take one capsule by mouth daily.    lisdexamfetamine (VYVANSE) 30 mg capsule Take one capsule by mouth daily.    naltrexone (DEPADE) 50 mg tablet Take one tablet by mouth daily.    peginterferon alfa-2a (PEGASYS) 180 mcg/0.5 mL injection syringe Inject 0.5 mL under the skin every 7 days.     Vitals:    10/11/22 1133   BP: (!) 133/92   BP Source: Arm, Right Upper   Pulse: 88   Temp: 36.9 ?C (98.5 ?F)   Resp: 16   SpO2: 100%   PainSc: Zero   Weight: 76.7 kg (169 lb)     Body mass index is 27.36 kg/m?Marland Kitchen     Physical Exam  Constitutional:       General: She is not in acute distress.     Appearance: Normal appearance. She is normal weight.   HENT:      Head: Normocephalic and atraumatic.      Nose: Nose normal.      Mouth/Throat:      Mouth: Mucous membranes are moist.      Pharynx: Oropharynx is clear.   Eyes:      General: No scleral icterus.        Right eye: No discharge.         Left eye: No discharge.      Extraocular Movements: Extraocular movements intact.      Conjunctiva/sclera: Conjunctivae normal.   Pulmonary:      Effort: Pulmonary effort is normal. No respiratory distress.      Breath sounds: Normal breath sounds. No stridor. No wheezing.   Musculoskeletal:         General: Normal range of motion.      Cervical back: Normal range of motion.   Lymphadenopathy:      Cervical: No cervical adenopathy.      Upper Body:      Right upper body: No supraclavicular or axillary adenopathy.      Left upper body: No supraclavicular or axillary adenopathy.      Lower Body: No right inguinal adenopathy. No left inguinal adenopathy.   Skin:     Coloration: Skin is not jaundiced or pale.      Findings: No bruising, erythema, lesion or rash.   Neurological:      General: No focal deficit present.      Mental Status: She is alert and oriented to person, place, and time. Mental status is at baseline.      Cranial Nerves: No cranial nerve deficit.      Motor: No weakness.      Gait: Gait normal.   Psychiatric:         Mood and Affect: Mood normal.         Behavior: Behavior normal.         Thought Content: Thought content normal.         Judgment: Judgment normal.       RN in room: [facility]  Assessment  and Plan:        Polycythemia vera (HCC)  Everlie Eble is a 37 y.o. female who presents for follow up for PV.       In regard to diagnosis:  Patient presents with significant erythrocytosis and panmyelosis.  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  Serum EPO level suppressed.  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  Thrombocytosis and leukocytosis can also be observed in PV.     The patient has high risk bone marrow features concerning for possible evidence of early myelofibrosis.  This is not associated with increased LDH, leukoerythroblastosis, or other high risk features at this time.  Repeat bone marrow biopsy at [facility] as possible dysplastic features, this is not clinically consistent with the patient's presentation with persistent panmyelosis and requirement for phlebotomy.  Her cytogenetics and molecular profile is not of high concern.  Future bone marrow biopsy will be required to further delineate disease evolution.     In regard to prognosis  I counseled the patient and her husband regarding the prognosis polycythemia vera which is associated with normal life expectancy to compared to peers,in general, however will require therapy to reduce disease related symptoms and disease related complications..  I also warned the patient about the risk of transformation to high risk disease in future.  We will continue to watch for evolution by serial bone marrow biopsies     In regard to goals of care  I emphasized the importance of symptom control as well as a hematocrit control to achieve a hematocrit goal of under 45% consistently.  Also discussed with the patient the goal of reducing thrombotic risk with aspirin.  Patient has researched interferon use and is interested in interferon therapy early on for as a disease modifying intervention.     The patient has achieved cytoreduction with phlebotomy however she continues to have significant symptom burden and poor quality of life.  She is interested in pharmacotherapy.  We discussed at length the use of hydroxyurea and interferon, mechanism of action, pros and cons, financial aspects and logistic aspects.  I have also discussed the collaboration with her primary oncologist locally given her long commute.  After discussion she opted to proceed with pegylated interferon therapy.  She signed informed consent for the interferon.  She met with the pharmacist for further education.  We started the patient on interferon in August after she returned from her vacation.     Treatment  Pegasys 45 mcg subcutaneously weekly Started 01/26/21   Pegasys 90 mcg subcutaneously weekly Started 03/16/21    Pegasys 135 mcg subcutaneously weekly Started 04/27/2021  Pegasys 180 mcg subcutaneously weekly Started April 2023     Educated patient and her husband regarding symptoms and signs of depression anxiety. The patient has had successful therapy, with reduced need for phlebotomy (last October 2023, twice total in 2023).     Will continue basis at the same dose and schedule  To continue collaboration with her local oncologist and primary care physician.      In regard to health maintenance, survivorship  and primary prevention  The patient had abnormal breast mass for which she underwent imaging that is consistent with benign findings.    Exam her primary care and gynecology.  Will  seek attention and management for her menstrual symptoms.   Recommended skin exam by dermatologist.  For the primary care prevention for cardiovascular risk reduction.  Is not clear if changes in renal function or hypertension is related to therapy.  This is being managed by her local physicians.    RTC in 6 months with peripheral JAK2 testing.       Jonni Sanger, MD,           Total of 40 minutes were spent on the same day of the visit including preparing to see the patient, obtaining  and/or reviewing separately obtained history, performing a medically appropriate examination and/or  evaluation, counseling and educating the patient/family/caregiver, ordering medications, tests, or  procedures, referring and communication with other health care professionals, documenting clinical  information in the electronic or other health record, independently interpreting results and communicating  results to the patient/family/caregiver, and care coordination.

## 2022-10-12 ENCOUNTER — Encounter: Admit: 2022-10-12 | Discharge: 2022-10-12 | Payer: Private Health Insurance - Indemnity

## 2022-11-19 ENCOUNTER — Encounter: Admit: 2022-11-19 | Discharge: 2022-11-19 | Payer: Private Health Insurance - Indemnity

## 2022-12-01 ENCOUNTER — Encounter: Admit: 2022-12-01 | Discharge: 2022-12-01 | Payer: Private Health Insurance - Indemnity

## 2022-12-06 ENCOUNTER — Encounter: Admit: 2022-12-06 | Discharge: 2022-12-06 | Payer: Private Health Insurance - Indemnity

## 2022-12-06 DIAGNOSIS — D45 Polycythemia vera: Secondary | ICD-10-CM

## 2022-12-06 MED ORDER — PEGASYS 180 MCG/0.5 ML SC SYRG
3 refills
Start: 2022-12-06 — End: ?

## 2022-12-13 ENCOUNTER — Encounter: Admit: 2022-12-13 | Discharge: 2022-12-13 | Payer: Private Health Insurance - Indemnity

## 2022-12-13 DIAGNOSIS — D45 Polycythemia vera: Secondary | ICD-10-CM

## 2022-12-13 MED ORDER — PEGASYS 180 MCG/0.5 ML SC SYRG
180 ug | SUBCUTANEOUS | 3 refills | Status: AC
Start: 2022-12-13 — End: ?

## 2022-12-13 NOTE — Progress Notes
Name:Ivania Bronnie Fanta           MRN: [id number]                 DOB:[birth year]          Age: 37 y.o.  Date of Service: 12/13/2022      Oral Chemotherapy Refill Review     Patient Information:     Correct patient/DOB/Diagnosis: Yes    Reviewed most recent clinic notes for changes in plan: Yes    Most recent labs reviewed: Yes    Appointment Information:   Date of last appointment: 10/11/2022    Follow up appointment scheduled : Yes 04/04/2023      Medication Information:     Medication name: Pegasys    Medication in [place] Plan: Yes     Date of last refill released from [facility]: 09/07/2022    Weight change >10%: Not applicable    Has a dose adjustment been made since last refill?No    Is Pharmacy listed in [place] treatment plan? Yes    Pharmacy: [facility]    Consent Date 01/11/2022    Patient Contact  Method of communication: MyChart  Confirmed need for refill: Yes  Patient taking medication as directed Yes  Confirmation of receiving pharmacy: Yes  Patient is anticipating a start date of: continuation of therapy     Other Information:     Plan was routed to Dr Molli Knock

## 2023-03-29 ENCOUNTER — Encounter: Admit: 2023-03-29 | Discharge: 2023-03-29 | Payer: Private Health Insurance - Indemnity

## 2023-03-29 DIAGNOSIS — D45 Polycythemia vera: Secondary | ICD-10-CM

## 2023-03-29 MED ORDER — PEGASYS 180 MCG/0.5 ML SC SYRG
3 refills
Start: 2023-03-29 — End: ?

## 2023-03-30 ENCOUNTER — Encounter: Admit: 2023-03-30 | Discharge: 2023-03-30 | Payer: Private Health Insurance - Indemnity

## 2023-03-30 DIAGNOSIS — D45 Polycythemia vera: Secondary | ICD-10-CM

## 2023-03-30 MED ORDER — PEGASYS 180 MCG/0.5 ML SC SYRG
180 ug | SUBCUTANEOUS | 3 refills | Status: AC
Start: 2023-03-30 — End: ?

## 2023-03-30 NOTE — Progress Notes
Name:Kelsey [name]           MRN: [id number]                 DOB:Jan 02, 1986          Age: 37 y.o.  Date of Service: 03/30/23      Oral Chemotherapy Refill Review     Patient Information:     Correct patient/DOB/Diagnosis: Yes    Reviewed most recent clinic notes for changes in plan: Yes    Most recent labs reviewed: Yes    Appointment Information:   Date of last appointment: 5//6/24    Follow up appointment scheduled : Yes 04/04/2023      Medication Information:     Medication name: Pegasys    Medication in [place] Plan: Yes     Date of last refill released from [facility]: 12/13/22    Weight change >10%: Not applicable    Has a dose adjustment been made since last refill?No    Is Pharmacy listed in [place] treatment plan? Yes    Pharmacy: [facility]    Consent Date 01/11/22    Patient Contact  Method of communication: MyChart  Confirmed need for refill: Yes  Patient taking medication as directed Yes  Confirmation of receiving pharmacy: Yes  Patient is anticipating a start date of: Continuation of therapy    Other Information:     Plan was routed to Dr. Molli Knock

## 2023-04-01 ENCOUNTER — Encounter: Admit: 2023-04-01 | Discharge: 2023-04-01 | Payer: Private Health Insurance - Indemnity

## 2023-04-01 DIAGNOSIS — D45 Polycythemia vera: Secondary | ICD-10-CM

## 2023-04-04 ENCOUNTER — Encounter: Admit: 2023-04-04 | Discharge: 2023-04-04 | Payer: Private Health Insurance - Indemnity

## 2023-04-04 DIAGNOSIS — D45 Polycythemia vera: Secondary | ICD-10-CM

## 2023-04-04 NOTE — Progress Notes
Specialty Medication Pharmacist Reassessment Note    Summary of Therapy  Kelsey [name] continues Pegasys for the treatment of polycythemia vera.  Cycle 1 start date was 01/26/21. Kelsey [name] confirms she is taking the medication as prescribed - 180 mcg subcutaneously weekly.     Adverse Effects and Adherence Assessment   Kelsey [name] is not experiencing any significant adverse effects to this medication regimen.     The patient's ability to self-administer medication was re-assessed. The patient has the ability to self-administer this medication.  Kelsey [name] reports missing 0 doses in the past 3 month(s) of therapy. she was re-educated on importance of adherence.    Dose Appropriateness Assessment  No renal or hepatic dose adjustment is required at this time and treatment will continue until progression or unacceptable toxicity.        CBC w diff    Lab Results   Component Value Date/Time    WBC 6.0 04/04/2023 02:35 PM    RBC 4.89 04/04/2023 02:35 PM    HGB 13.1 04/04/2023 02:35 PM    HCT 39.6 04/04/2023 02:35 PM    MCV 80.8 04/04/2023 02:35 PM    MCH 26.8 04/04/2023 02:35 PM    MCHC 33.2 04/04/2023 02:35 PM    RDW 14.8 04/04/2023 02:35 PM    PLTCT 438 (H) 04/04/2023 02:35 PM    MPV 8.4 04/04/2023 02:35 PM    Lab Results   Component Value Date/Time    NEUT 75 04/04/2023 02:35 PM    ANC 4.50 04/04/2023 02:35 PM    LYMA 16 (L) 04/04/2023 02:35 PM    ALC 0.90 (L) 04/04/2023 02:35 PM    MONA 8 04/04/2023 02:35 PM    AMC 0.50 04/04/2023 02:35 PM    EOSA 1 04/04/2023 02:35 PM    AEC 0.10 04/04/2023 02:35 PM    BASA 0 04/04/2023 02:35 PM    ABC 0.00 04/04/2023 02:35 PM        Comprehensive Metabolic Profile    Lab Results   Component Value Date/Time    NA 137 04/04/2023 02:35 PM    K 4.4 04/04/2023 02:35 PM    CL 104 04/04/2023 02:35 PM    CO2 28 04/04/2023 02:35 PM    GAP 5 04/04/2023 02:35 PM    BUN 13 04/04/2023 02:35 PM    CR 0.79 04/04/2023 02:35 PM    GLU 106 (H) 04/04/2023 02:35 PM    Lab Results   Component Value Date/Time    CA 9.2 04/04/2023 02:35 PM    ALBUMIN 4.2 04/04/2023 02:35 PM    TOTPROT 6.8 04/04/2023 02:35 PM    ALKPHOS 53 04/04/2023 02:35 PM    AST 16 04/04/2023 02:35 PM    ALT 20 04/04/2023 02:35 PM    TOTBILI 0.3 04/04/2023 02:35 PM            Creatinine clearance cannot be calculated (Unknown ideal weight.)    Medication Reconciliation and Drug/Food Interaction Assessment  A comprehensive medication reconciliation was performed for Kelsey [name]. her medication list was updated to reflect any identified changes and the patient?s current medication list is included below. Kelsey [name] was instructed to speak with her healthcare provider and/or the oral chemotherapy pharmacist before starting any new drug, including, but not limited to, prescription, over the counter, natural / herbal products, or vitamins and supplements.     Drug-drug and drug-food interactions were assessed between the patients? specialty medication(s) and her most current medication list. No significant drug-drug  interactions were identified.     Home Medications    Medication Sig   aspirin EC 81 mg tablet Take one tablet by mouth daily. Take with food.   buPROPion XL (WELLBUTRIN XL) 150 mg tablet    diltiazem CD (CARDIZEM CD) 240 mg capsule Take one capsule by mouth daily.   lisdexamfetamine (VYVANSE) 30 mg capsule Take one capsule by mouth daily.   naltrexone (DEPADE) 50 mg tablet Take one tablet by mouth daily.   peginterferon alfa-2a (PEGASYS) 180 mcg/0.5 mL injection syringe Inject 0.5 mL under the skin every 7 days.       No Known Allergies    Response to Therapy Assessment  The electronic medical record for Kelsey [name] has been reviewed. The patient?s provider has determined this therapy is appropriate to continue at this time. The patient will continue this therapy as she is achieving therapeutic benefit.     Immunization Assessment     There is no immunization history on file for this patient.    Vaccine history and recommended vaccinations were reviewed and will be recommended as appropriate. The patient will be reminded about the importance of receiving an annual influenza vaccine as indicated.      Safety Assessment  Risk Evaluation and Mitigation Strategy (REMS)  No REMS is required for this medication.    Reproductive Risk  Kelsey [name] is a 37 y.o. female. As patient is a female of child-bearing potential, she was instructed regarding effective contraception during and after treatment, and that in the event that she becomes pregnant she should contact her physician immediately.     Handling and Disposal  Appropriate safe handling and disposal procedures were reviewed with the patient. Kelsey [name] was instructed to return any unused or expired oral chemotherapy medication to a designated disposal bin at one of the [facility] locations or to utilize a community drug take back program. Instructed not to flush down the toilet or to crush the medication    Follow-up Plan   Kelsey [name] was encouraged to call the oral chemotherapy pharmacist at [phone] with questions. This medication is considered medium risk per our internal oral chemotherapy risk categorization. This re-assessment has been completed and the next reassessment planned for 12 months.    Doristine Bosworth, [facility]  Oncology Clinical Pharmacist  04/04/2023

## 2023-04-04 NOTE — Assessment & Plan Note
Kelsey [name] is a 37 y.o. female who presents for follow up for PV.       In regard to diagnosis:  Patient presents with significant erythrocytosis and panmyelosis.  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  Serum EPO level suppressed.  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  Thrombocytosis and leukocytosis can also be observed in PV.     The patient has high risk bone marrow features concerning for possible evidence of early myelofibrosis.  This is not associated with increased LDH, leukoerythroblastosis, or other high risk features at this time.  Repeat bone marrow biopsy at [facility] as possible dysplastic features, this is not clinically consistent with the patient's presentation with persistent panmyelosis and requirement for phlebotomy.  Her cytogenetics and molecular profile is not of high concern.  Future bone marrow biopsy will be required to further delineate disease evolution.     In regard to prognosis  I counseled the patient and her husband regarding the prognosis polycythemia vera which is associated with normal life expectancy to compared to peers,in general, however will require therapy to reduce disease related symptoms and disease related complications..  I also warned the patient about the risk of transformation to high risk disease in future.  We will continue to watch for evolution by serial bone marrow biopsies     In regard to goals of care  I emphasized the importance of symptom control as well as a hematocrit control to achieve a hematocrit goal of under 45% consistently.  Also discussed with the patient the goal of reducing thrombotic risk with aspirin.  Patient has researched interferon use and is interested in interferon therapy early on for as a disease modifying intervention.     The patient has achieved cytoreduction with phlebotomy however she continues to have significant symptom burden and poor quality of life.  She is interested in pharmacotherapy.  We discussed at length the use of hydroxyurea and interferon, mechanism of action, pros and cons, financial aspects and logistic aspects.  I have also discussed the collaboration with her primary oncologist locally given her long commute.  After discussion she opted to proceed with pegylated interferon therapy.  She signed informed consent for the interferon.  She met with the pharmacist for further education.  We started the patient on interferon in August after she returned from her vacation.     Treatment  Pegasys 45 mcg subcutaneously weekly Started 01/26/21   Pegasys 90 mcg subcutaneously weekly Started 03/16/21    Pegasys 135 mcg subcutaneously weekly Started 04/27/2021  Pegasys 180 mcg subcutaneously weekly Started April 2023     Educated patient and her husband regarding symptoms and signs of depression anxiety. The patient has had successful therapy, with reduced need for phlebotomy and reduction in platelet count and symptoms.    Will continue basis at the same dose and schedule, now following with Dr. Rosalita Chessman  To continue collaboration with her local oncologist and primary care physician.      In regard to health maintenance, survivorship  and primary prevention  The patient had abnormal breast mass for which she underwent imaging that is consistent with benign findings.    Exam her primary care and gynecology.  Will seek attention and management for her menstrual symptoms.  Recommended skin exam by dermatologist.  For the primary care prevention for cardiovascular risk reduction.  Is not clear if changes in renal function or hypertension is related to therapy.  This is being managed by her local physicians.    RTC in 12 months with peripheral JAK2 testing.

## 2023-04-04 NOTE — Patient Instructions
Your care team:     Dr. Jonni Sanger   Hematologist   Elige Ko, APRN   Nurse Practitioner   Clinical Nurse Coordinators (CNC's)   Leanne Chang RN, BSN, Rolan Bucco RN, BSN, Ruthine Dose RN      Phone numbers:     Scheduling # [phone]    CNCs [facility]  # [phone]  Messages left on nurses' line are checked Monday-Friday 8:00 AM - 4:00 PM.    Evening (after 4:00 PM), weekend and holiday on-call # [phone]   For urgent needs after hours, please ask for the oncologist on-call to be paged.   For urgent needs during business hours, please ask for [facility] Dr. Percival Spanish nurses, to be paged.    Notes:   - Allow one business week for our office to complete any requested paperwork (FMLA, etc.)   - Allow two to three business days for all medication refills. Please call your pharmacy first to check for available refills.

## 2023-04-04 NOTE — Progress Notes
Date of Service: 04/04/2023    Name: Kelsey [name]  DOB: 1985/06/27  MRN: [id number]     Subjective:            Kelsey [name] is a 37 y.o. 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 The patient reports that she has been doing well since the last visit.  Feels much better today, has actively lost weight, nearly 30 pounds in the last 6 months with diet and exercise.  She has no side effects to interferon therapy.  She has no MPN symptoms.  Continues to require phlebotomy intermittently.  Platelets continue to improve.         History of Present Illness        Family Hx: Father-prostate cancer    MGM-ovarian cancer    MGF-brain tumor  Great aunt-breast cancer    Medical Hx: Depression/anxiety, COVID infection September 2021   Surgical Hx: Wisdom tooth extraction    Social Hx:  Married  Current smoker   Alcohol use    HPI:  Patient was seen for consult by hematology 04/23/2020 due to an elevated platelet count that first became evident in early November.  Patient had no previous history of thrombocytosis.   Patient's work up included additional labs and a BMBX.  She is a direct referral to Dr Molli Knock to discuss treatment options.        Records are in O2 including outside records that have been scanned into the chart.  Additional records are in Care Everywhere.     Timeline of Events:   DATES          04/15/2020 Labs CBC:  WBC 11.7, Hg 15.5, Hct 48.3,  platelets 817   04/17/2020 Labs  Peripheral blood:  Erythrocytosis and thrombocytosis    04/23/2020 Labs CBC:  WBc 10.2, Hg 14.8, Hct 47.4, platelets 888  Folate:  16.65  B12:  564  Iron studies:  Iron 62.00, TIBC 335.0, % Sat 18.51, Ferritin  32.0  LDH:  210  Erythropoietin:  2.4   05/27/2020 US-Abdomen complete  Impression:  Minimal hepatosplenomegaly just above normal limites.  Normal echogenicity of the liver and spleen.     06/11/2020 BMBX BM:  Normocellular bone with megakaryocytic hyperplasia and mild reticulin fibrosis.    FLOW:  No diagnostic immunophenotypic abnormalities detected.  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 Negative for activity change, appetite change, chills, fever and unexpected weight change.   HENT: Negative for congestion, mouth sores, sore throat, tinnitus and trouble swallowing.    Eyes: Positive for visual disturbance.   Respiratory: Negative for cough, chest tightness, shortness of breath and wheezing.    Cardiovascular: Negative for chest pain, palpitations and leg swelling.   Gastrointestinal: Negative for abdominal distention, abdominal pain, blood in stool, constipation, diarrhea, nausea and vomiting.   Genitourinary: Negative for difficulty urinating, dysuria, frequency and urgency.   Musculoskeletal: Negative for arthralgias, back pain and joint swelling.   Skin: Negative for rash.   Neurological: Positive for headaches (improved). Negative for dizziness, weakness and numbness.   Hematological: Negative for adenopathy. Does not bruise/bleed easily.   Psychiatric/Behavioral: Negative for  sleep disturbance. The patient is not nervous/anxious.         Objective:          aspirin EC 81 mg tablet Take one tablet by mouth daily. Take with food.    buPROPion XL (WELLBUTRIN XL) 150 mg tablet     diltiazem CD (CARDIZEM CD) 240 mg capsule Take one capsule by mouth daily.    lisdexamfetamine (VYVANSE) 30 mg capsule Take one capsule by mouth daily.    naltrexone (DEPADE) 50 mg tablet Take one tablet by mouth daily.    peginterferon alfa-2a (PEGASYS) 180 mcg/0.5 mL injection syringe Inject 0.5 mL under the skin every 7 days.     Vitals:    04/04/23 1528   BP: (!) 135/96   BP Source: Arm, Left Upper   Pulse: 91   Temp: 37.2 ?C (98.9 ?F)   Resp: 16   SpO2: 98%   TempSrc: Temporal   PainSc: Zero Weight: 79.7 kg (175 lb 9.6 oz)     Body mass index is 28.42 kg/m?Marland Kitchen     Physical Exam  Constitutional:       General: She is not in acute distress.     Appearance: Normal appearance. She is normal weight.   HENT:      Head: Normocephalic and atraumatic.      Nose: Nose normal.      Mouth/Throat:      Mouth: Mucous membranes are moist.      Pharynx: Oropharynx is clear.   Eyes:      General: No scleral icterus.        Right eye: No discharge.         Left eye: No discharge.      Extraocular Movements: Extraocular movements intact.      Conjunctiva/sclera: Conjunctivae normal.   Pulmonary:      Effort: Pulmonary effort is normal. No respiratory distress.      Breath sounds: Normal breath sounds. No stridor. No wheezing.   Musculoskeletal:         General: Normal range of motion.      Cervical back: Normal range of motion.   Lymphadenopathy:      Cervical: No cervical adenopathy.      Upper Body:      Right upper body: No supraclavicular or axillary adenopathy.      Left upper body: No supraclavicular or axillary adenopathy.      Lower Body: No right inguinal adenopathy. No left inguinal adenopathy.   Skin:     Coloration: Skin is not jaundiced or pale.      Findings: No bruising, erythema, lesion or rash.   Neurological:      General: No focal deficit present.      Mental Status: She is alert and oriented to person, place, and time. Mental status is at baseline.      Cranial Nerves: No cranial nerve deficit.      Motor: No weakness.      Gait: Gait normal.   Psychiatric:         Mood and Affect: Mood normal.         Behavior: Behavior normal.         Thought Content: Thought content normal.         Judgment: Judgment normal.       RN in room: [facility]  Assessment and Plan:        Polycythemia vera (HCC)  Kelsey [name] is a 37 y.o. female who presents  for follow up for PV.       In regard to diagnosis:  Patient presents with significant erythrocytosis and panmyelosis.  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 Serum EPO level suppressed.  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  Thrombocytosis and leukocytosis can also be observed in PV.     The patient has high risk bone marrow features concerning for possible evidence of early myelofibrosis.  This is not associated with increased LDH, leukoerythroblastosis, or other high risk features at this time.  Repeat bone marrow biopsy at [facility] as possible dysplastic features, this is not clinically consistent with the patient's presentation with persistent panmyelosis and requirement for phlebotomy.  Her cytogenetics and molecular profile is not of high concern.  Future bone marrow biopsy will be required to further delineate disease evolution.     In regard to prognosis  I counseled the patient and her husband regarding the prognosis polycythemia vera which is associated with normal life expectancy to compared to peers,in general, however will require therapy to reduce disease related symptoms and disease related complications..  I also warned the patient about the risk of transformation to high risk disease in future.  We will continue to watch for evolution by serial bone marrow biopsies     In regard to goals of care  I emphasized the importance of symptom control as well as a hematocrit control to achieve a hematocrit goal of under 45% consistently.  Also discussed with the patient the goal of reducing thrombotic risk with aspirin.  Patient has researched interferon use and is interested in interferon therapy early on for as a disease modifying intervention.     The patient has achieved cytoreduction with phlebotomy however she continues to have significant symptom burden and poor quality of life.  She is interested in pharmacotherapy.  We discussed at length the use of hydroxyurea and interferon, mechanism of action, pros and cons, financial aspects and logistic aspects.  I have also discussed the collaboration with her primary oncologist locally given her long commute.  After discussion she opted to proceed with pegylated interferon therapy.  She signed informed consent for the interferon.  She met with the pharmacist for further education.  We started the patient on interferon in August after she returned from her vacation.     Treatment  Pegasys 45 mcg subcutaneously weekly Started 01/26/21   Pegasys 90 mcg subcutaneously weekly Started 03/16/21    Pegasys 135 mcg subcutaneously weekly Started 04/27/2021  Pegasys 180 mcg subcutaneously weekly Started April 2023     Educated patient and her husband regarding symptoms and signs of depression anxiety. The patient has had successful therapy, with reduced need for phlebotomy and reduction in platelet count and symptoms.    Will continue basis at the same dose and schedule, now following with Dr. Rosalita Chessman  To continue collaboration with her local oncologist and primary care physician.      In regard to health maintenance, survivorship  and primary prevention  The patient had abnormal breast mass for which she underwent imaging that is consistent with benign findings.    Exam her primary care and gynecology.  Will seek attention and management for her menstrual symptoms.   Recommended skin exam by dermatologist.  For the  primary care prevention for cardiovascular risk reduction.  Is not clear if changes in renal function or hypertension is related to therapy.  This is being managed by her local physicians.    RTC in 12 months with peripheral JAK2 testing.         Jonni Sanger, MD,           Total of 40 minutes were spent on the same day of the visit including preparing to see the patient, obtaining  and/or reviewing separately obtained history, performing a medically appropriate examination and/or  evaluation, counseling and educating the patient/family/caregiver, ordering medications, tests, or  procedures, referring and communication with other health care professionals, documenting clinical  information in the electronic or other health record, independently interpreting results and communicating  results to the patient/family/caregiver, and care coordination.

## 2023-08-11 ENCOUNTER — Encounter: Admit: 2023-08-11 | Discharge: 2023-08-11 | Payer: Private Health Insurance - Indemnity

## 2024-03-01 ENCOUNTER — Encounter: Admit: 2024-03-01 | Discharge: 2024-03-01 | Payer: PRIVATE HEALTH INSURANCE

## 2024-03-02 ENCOUNTER — Encounter: Admit: 2024-03-02 | Discharge: 2024-03-02 | Payer: PRIVATE HEALTH INSURANCE

## 2024-03-13 ENCOUNTER — Encounter: Admit: 2024-03-13 | Discharge: 2024-03-13 | Payer: PRIVATE HEALTH INSURANCE

## 2024-03-13 DIAGNOSIS — D45 Polycythemia vera: Principal | ICD-10-CM

## 2024-03-13 MED ORDER — PEGASYS 180 MCG/0.5 ML SC SYRG
180 ug | SUBCUTANEOUS | 3 refills | 28.00000 days | Status: AC
Start: 2024-03-13 — End: ?

## 2024-03-13 NOTE — Progress Notes
 Name:Kelsey [name]           MRN: [id number]                 DOB:Oct 10, 1985          Age: 38 y.o.  Date of Service: 03/13/24      Oral Chemotherapy Refill Review     Patient Information:     Correct patient/DOB/Diagnosis: Yes    Reviewed most recent clinic notes for changes in plan: Yes    Most recent labs reviewed: Yes    Appointment Information:   Date of last appointment: 04/04/23    Follow up appointment scheduled : Yes 04/05/2024      Medication Information:     Medication name: Pegasys     Medication in [place] Plan: Yes     Date of last refill released from [facility]: 03/30/23    Weight change >10%: No    Has a dose adjustment been made since last refill?No    Is Pharmacy listed in [place] treatment plan? Yes    Pharmacy: [facility] - [place], [place] - [address]   [address] [place] [address]     Consent Date 04/04/23    Patient Contact  Method of communication: MyChart  Confirmed need for refill: Yes  Patient taking medication as directed Yes  Confirmation of receiving pharmacy: Yes  Patient is anticipating a start date of: Continuation of medication    Other Information:     Plan was routed to Chuckie Packer MD

## 2024-04-02 ENCOUNTER — Encounter
Admit: 2024-04-02 | Discharge: 2024-04-02 | Payer: PRIVATE HEALTH INSURANCE | Primary: Student in an Organized Health Care Education/Training Program

## 2024-04-05 ENCOUNTER — Encounter
Admit: 2024-04-05 | Discharge: 2024-04-05 | Payer: PRIVATE HEALTH INSURANCE | Primary: Student in an Organized Health Care Education/Training Program

## 2024-04-05 VITALS — BP 117/84 | HR 92 | Temp 97.40000°F | Resp 16 | Wt 193.0 lb

## 2024-04-05 DIAGNOSIS — D45 Polycythemia vera: Principal | ICD-10-CM

## 2024-04-05 DIAGNOSIS — Z006 Encounter for examination for normal comparison and control in clinical research program: Secondary | ICD-10-CM

## 2024-04-05 NOTE — Research [[id number]]
 Study Title: [id number] Research Tissue Bank  HSC: [id number]  Consent Version Date: 11/08/2023     MPN-RC Research Tissue bank was discussed with subject and her mother during this visit. Subject was alert and oriented during consent discussion. Subject was informed that tissue bank participation is voluntary and she may withdraw consent at any time for any reason by notifying study team. The decision to withdraw will not affect care the patient receives here. Study purpose and samples to be obtained were discussed     Subject was given time to review the consent form and to discuss participation in this study. Questions asked were answered to her satisfaction and subject voiced desire to sign consent today. Subject signed consent without coercion and undue influence. A copy of the signed consent was given to the subject. Contact information for the study team was given to the subject. Consent was scanned to the medical record.       Subject consented to this study in 2022, under an earlier consent. No samples were collected under earlier consent because subject did not have enough time for a lab appointment.    Today, she re-consented under new version dated 11/08/2023.    No procedures took place prior to consenting.    After consenting, subject went to lab for baseline draw.

## 2024-04-05 NOTE — Progress Notes [1]
 Date of Service: 04/05/2024    Name: Kelsey [name]  DOB: 09-Mar-1986  MRN: [id number]     Subjective:            Kelsey [name] is a 38 y.o. 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   The patient has been adherent to therapy with excellent control of her blood.  Her last phlebotomy was in February 2025.  Today she reports headaches that she attributes to slightly higher blood numbers but she does not meet criteria for phlebotomy.  She has no side effects to therapy denies depression anxiety or autoimmune symptoms.  She is interested in pharmacological weight reduction.           History of Present Illness        Family Hx: Father-prostate cancer    MGM-ovarian cancer    MGF-brain tumor  Great aunt-breast cancer    Medical Hx: Depression/anxiety, COVID infection September 2021   Surgical Hx: Wisdom tooth extraction    Social Hx:  Married  Current smoker   Alcohol use    HPI:  Patient was seen for consult by hematology 04/23/2020 due to an elevated platelet count that first became evident in early November.  Patient had no previous history of thrombocytosis.   Patient's work up included additional labs and a BMBX.  She is a direct referral to Dr Clotilda to discuss treatment options.        Records are in O2 including outside records that have been scanned into the chart.  Additional records are in Care Everywhere.     Timeline of Events:   DATES          04/15/2020 Labs CBC:  WBC 11.7, Hg 15.5, Hct 48.3,  platelets 817   04/17/2020 Labs  Peripheral blood:  Erythrocytosis and thrombocytosis    04/23/2020 Labs CBC:  WBc 10.2, Hg 14.8, Hct 47.4, platelets 888  Folate:  16.65  B12:  564  Iron studies:  Iron 62.00, TIBC 335.0, % Sat 18.51, Ferritin  32.0  LDH:  210  Erythropoietin:  2.4   05/27/2020 US -Abdomen complete  Impression:  Minimal hepatosplenomegaly just above normal limites.  Normal echogenicity of the liver and spleen.     06/11/2020 BMBX BM:  Normocellular bone with megakaryocytic hyperplasia and mild reticulin fibrosis.    FLOW:  No diagnostic immunophenotypic abnormalities detected.  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 Negative for activity change, appetite change, chills, fever and unexpected weight change.   HENT: Negative for congestion, mouth sores, sore throat, tinnitus and trouble swallowing.    Respiratory: Negative for cough, chest tightness, shortness of breath and wheezing.    Cardiovascular: Negative for chest pain, palpitations and leg swelling.   Gastrointestinal: Negative for abdominal distention, abdominal pain, blood in stool, constipation, diarrhea, nausea and vomiting.   Genitourinary: Negative for difficulty urinating, dysuria, frequency and urgency.   Musculoskeletal: Negative for arthralgias, back pain and joint swelling.   Skin: Negative for rash.   Neurological: Positive for headaches (improved). Negative for dizziness, weakness and numbness.   Hematological: Negative for adenopathy. Does not bruise/bleed easily.   Psychiatric/Behavioral: Negative  for sleep disturbance. The patient is not nervous/anxious.         Objective:          amphetamine-dextroamphetamine XR (ADDERALL XR) 10 mg capsule Take one capsule by mouth every morning.    aspirin EC 81 mg tablet Take one tablet by mouth daily. Take with food.    buPROPion XL (WELLBUTRIN XL) 150 mg tablet     diltiazem CD (CARDIZEM CD) 240 mg capsule Take one capsule by mouth daily.    lisdexamfetamine (VYVANSE) 30 mg capsule Take one capsule by mouth daily. (Patient not taking: Reported on 04/05/2024)    naltrexone (DEPADE) 50 mg tablet Take one tablet by mouth daily. (Patient not taking: Reported on 04/05/2024)    peginterferon alfa-2a  (PEGASYS ) 180 mcg/0.5 mL injection syringe Inject 0.5 mL under the skin every 7 days.     Vitals:    04/05/24 1055   BP: 117/84   BP Source: Arm, Left Upper   Pulse: 92   Temp: 36.3 ?C (97.4 ?F)   Resp: 16   SpO2: 98%   TempSrc: Temporal   PainSc: Five   Weight: 87.5 kg (193 lb)     Body mass index is 31.24 kg/m?SABRA     Physical Exam  Constitutional:       General: She is not in acute distress.     Appearance: Normal appearance. She is normal weight.   HENT:      Head: Normocephalic and atraumatic.      Nose: Nose normal.      Mouth/Throat:      Mouth: Mucous membranes are moist.      Pharynx: Oropharynx is clear.   Eyes:      General: No scleral icterus.        Right eye: No discharge.         Left eye: No discharge.      Extraocular Movements: Extraocular movements intact.      Conjunctiva/sclera: Conjunctivae normal.   Pulmonary:      Effort: Pulmonary effort is normal. No respiratory distress.      Breath sounds: Normal breath sounds. No stridor. No wheezing.   Musculoskeletal:         General: Normal range of motion.      Cervical back: Normal range of motion.   Lymphadenopathy:      Cervical: No cervical adenopathy.      Upper Body:      Right upper body: No supraclavicular or axillary adenopathy.      Left upper body: No supraclavicular or axillary adenopathy.      Lower Body: No right inguinal adenopathy. No left inguinal adenopathy.   Skin:     Coloration: Skin is not jaundiced or pale.      Findings: No bruising, erythema, lesion or rash.   Neurological:      General: No focal deficit present.      Mental Status: She is alert and oriented to person, place, and time. Mental status is at baseline.      Cranial Nerves: No cranial nerve deficit.      Motor: No weakness.      Gait: Gait normal.   Psychiatric:         Mood and Affect: Mood normal.         Behavior: Behavior normal.         Thought Content: Thought content normal.         Judgment: Judgment normal.  RN in room: [facility]  Assessment and Plan:        Polycythemia vera (CMS-HCC)  Kelsey [name] is a 38 y.o. female who presents for follow up for PV.       In regard to diagnosis:  Patient presents with significant erythrocytosis and panmyelosis.  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  Serum EPO level suppressed.  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  Thrombocytosis and leukocytosis can also be observed in PV.     The patient has high risk bone marrow features concerning for possible evidence of early myelofibrosis.  This is not associated with increased LDH, leukoerythroblastosis, or other high risk features at this time.  Repeat bone marrow biopsy at [facility]  as possible dysplastic features, this is not clinically consistent with the patient's presentation with persistent panmyelosis and requirement for phlebotomy.  Her cytogenetics and molecular profile is not of high concern.  Future bone marrow biopsy will be required to further delineate disease evolution.     In regard to prognosis  I counseled the patient and her husband regarding the prognosis polycythemia vera which is associated with normal life expectancy to compared to peers,in general, however will require therapy to reduce disease related symptoms and disease related complications..  I also warned the patient about the risk of transformation to high risk disease in future.  We will continue to watch for evolution by serial bone marrow biopsies     In regard to goals of care  I emphasized the importance of symptom control as well as a hematocrit control to achieve a hematocrit goal of under 45% consistently.  Also discussed with the patient the goal of reducing thrombotic risk with aspirin.  Patient has researched interferon use and is interested in interferon therapy early on for as a disease modifying intervention.     The patient has achieved cytoreduction with phlebotomy however she continues to have significant symptom burden and poor quality of life.  She is interested in pharmacotherapy.  We discussed at length the use of hydroxyurea and interferon, mechanism of action, pros and cons, financial aspects and logistic aspects.  I have also discussed the collaboration with her primary oncologist locally given her long commute.  After discussion she opted to proceed with pegylated interferon therapy.  She signed informed consent for the interferon.  She met with the pharmacist for further education.  We started the patient on interferon in August after she returned from her vacation.     Treatment  Pegasys  45 mcg subcutaneously weekly Started 01/26/21   Pegasys  90 mcg subcutaneously weekly Started 03/16/21    Pegasys  135 mcg subcutaneously weekly Started 04/27/2021  Pegasys  180 mcg subcutaneously weekly Started April 2023     Educated patient and her husband regarding symptoms and signs of depression anxiety. The patient has had successful therapy, with reduced need for phlebotomy and reduction in platelet count and symptoms.    Will continue basis at the same dose and schedule, now following with Dr. Mackey Bring  To continue collaboration with her local oncologist and primary care physician.      In regard to health maintenance, survivorship  and primary prevention  The patient had abnormal breast mass for which she underwent imaging that is consistent with benign findings.  Exam her primary care and gynecology.  Will seek attention and management for her menstrual symptoms.   Recommended skin exam by dermatologist.  For the primary care prevention for cardiovascular risk reduction.  Is not clear if changes in renal function or hypertension is related to therapy.  This is being managed by her local physicians.    RTC in 12 months with peripheral JAK2 testing.     The patient enrolled in the MPN RC 106 clinical trial today.  This is a noninterventional prospective tissue banking study.    Chuckie Packer, MD,           Total of 40 minutes were spent on the same day of the visit including preparing to see the patient, obtaining  and/or reviewing separately obtained history, performing a medically appropriate examination and/or  evaluation, counseling and educating the patient/family/caregiver, ordering medications, tests, or  procedures, referring and communication with other health care professionals, documenting clinical  information in the electronic or other health record, independently interpreting results and communicating  results to the patient/family/caregiver, and care coordination.

## 2024-04-05 NOTE — Patient Instructions [37]
 Your care team:     Dr. Izetta Marshall   Hematologist   Hettie Lota, APRN   Nurse Practitioner   Clinical Nurse Coordinators (CNC's)   Signa Drier RN, BSN, Lacinda Pica RN, BSN, Bebe Bourdon RN BSN, Allison Arena RN BSN       Phone numbers:     Scheduling # [phone]    CNCs Zell Hicks & Meg  # [phone]  Messages left on nurses' line are checked Monday-Friday 8:00 AM - 4:00 PM.    Evening (after 4:00 PM), weekend and holiday on-call # [phone]   For urgent needs after hours, please ask for the oncologist on-call to be paged.   For urgent needs during business hours, please ask for Alissa April and Leena. Dr. Wilson Hasten nurses, to be paged.    Notes:   - Allow one business week for our office to complete any requested paperwork (FMLA, etc.)   - Allow two to three business days for all medication refills. Please call your pharmacy first to check for available refills.

## 2024-04-09 ENCOUNTER — Ambulatory Visit
Admit: 2024-04-09 | Discharge: 2024-04-09 | Payer: PRIVATE HEALTH INSURANCE | Primary: Student in an Organized Health Care Education/Training Program

## 2024-06-11 ENCOUNTER — Encounter
Admit: 2024-06-11 | Discharge: 2024-06-11 | Payer: PRIVATE HEALTH INSURANCE | Primary: Student in an Organized Health Care Education/Training Program

## 2024-06-13 ENCOUNTER — Encounter
Admit: 2024-06-13 | Discharge: 2024-06-13 | Payer: PRIVATE HEALTH INSURANCE | Primary: Student in an Organized Health Care Education/Training Program

## 2024-06-13 DIAGNOSIS — D45 Polycythemia vera: Principal | ICD-10-CM

## 2024-06-13 MED ORDER — PEGASYS 180 MCG/0.5 ML SC SYRG
180 ug | SUBCUTANEOUS | 3 refills | 28.00000 days | Status: AC
Start: 2024-06-13 — End: ?

## 2024-06-13 NOTE — Progress Notes [1]
 Name:Kelsey [name]           MRN: [id number]                 DOB:12-21-1985          Age: 39 y.o.  Date of Service: 06/13/2024      Oral Chemotherapy Refill Review     Patient Information:     Correct patient/DOB/Diagnosis: No    Reviewed most recent clinic notes for changes in plan: Yes    Most recent labs reviewed: Yes    Appointment Information:   Date of last appointment: 04/05/24    Follow up appointment scheduled : 04/04/25      Medication Information:     Medication name: Pegasys     Medication in [place] Plan: Yes     Date of last refill released from [facility]: 03/13/24    Weight change >10%: Not applicable    Has a dose adjustment been made since last refill?No    Is Pharmacy listed in [place] treatment plan? Yes    Pharmacy: [facility] - [place], [place] - [address]     Consent Date 04/05/24    Patient Contact  Method of communication: MyChart  Confirmed need for refill: Yes  Patient taking medication as directed Yes  Confirmation of receiving pharmacy: Yes  Patient is anticipating a start date of: Continuation of medication    Other Information:     Plan was routed to [employer]

## 2024-06-15 ENCOUNTER — Encounter
Admit: 2024-06-15 | Discharge: 2024-06-15 | Payer: PRIVATE HEALTH INSURANCE | Primary: Student in an Organized Health Care Education/Training Program

## 2024-06-18 ENCOUNTER — Encounter
Admit: 2024-06-18 | Discharge: 2024-06-18 | Payer: PRIVATE HEALTH INSURANCE | Primary: Student in an Organized Health Care Education/Training Program
